# Patient Record
Sex: Female | Born: 1955
Health system: Southern US, Community
[De-identification: ages and names within clinical notes are randomized; demographics above are authoritative.]

## PROBLEM LIST (undated history)

## (undated) DIAGNOSIS — E079 Disorder of thyroid, unspecified: Secondary | ICD-10-CM

## (undated) DIAGNOSIS — R011 Cardiac murmur, unspecified: Secondary | ICD-10-CM

## (undated) DIAGNOSIS — Z1509 Genetic susceptibility to other malignant neoplasm: Secondary | ICD-10-CM

## (undated) DIAGNOSIS — Z808 Family history of malignant neoplasm of other organs or systems: Secondary | ICD-10-CM

## (undated) DIAGNOSIS — C50919 Malignant neoplasm of unspecified site of unspecified female breast: Secondary | ICD-10-CM

## (undated) DIAGNOSIS — T7840XA Allergy, unspecified, initial encounter: Secondary | ICD-10-CM

## (undated) DIAGNOSIS — E785 Hyperlipidemia, unspecified: Secondary | ICD-10-CM

## (undated) DIAGNOSIS — K219 Gastro-esophageal reflux disease without esophagitis: Secondary | ICD-10-CM

## (undated) DIAGNOSIS — C801 Malignant (primary) neoplasm, unspecified: Secondary | ICD-10-CM

## (undated) DIAGNOSIS — C55 Malignant neoplasm of uterus, part unspecified: Secondary | ICD-10-CM

## (undated) DIAGNOSIS — I10 Essential (primary) hypertension: Secondary | ICD-10-CM

## (undated) DIAGNOSIS — Z803 Family history of malignant neoplasm of breast: Secondary | ICD-10-CM

## (undated) HISTORY — PX: TUBAL LIGATION: SHX77

## (undated) HISTORY — DX: Malignant (primary) neoplasm, unspecified: C80.1

## (undated) HISTORY — DX: Disorder of thyroid, unspecified: E07.9

## (undated) HISTORY — PX: CHOLECYSTECTOMY: SHX55

## (undated) HISTORY — DX: Essential (primary) hypertension: I10

## (undated) HISTORY — PX: COLON SURGERY: SHX602

## (undated) HISTORY — DX: Family history of malignant neoplasm of breast: Z80.3

## (undated) HISTORY — DX: Family history of malignant neoplasm of other organs or systems: Z80.8

## (undated) HISTORY — DX: Genetic susceptibility to other malignant neoplasm: Z15.09

## (undated) HISTORY — DX: Malignant neoplasm of unspecified site of unspecified female breast: C50.919

## (undated) HISTORY — DX: Cardiac murmur, unspecified: R01.1

## (undated) HISTORY — PX: ABDOMINAL HYSTERECTOMY: SHX81

## (undated) HISTORY — DX: Allergy, unspecified, initial encounter: T78.40XA

## (undated) HISTORY — DX: Gastro-esophageal reflux disease without esophagitis: K21.9

## (undated) HISTORY — DX: Hyperlipidemia, unspecified: E78.5

## (undated) HISTORY — DX: Malignant neoplasm of uterus, part unspecified: C55

---

## 2015-12-23 DIAGNOSIS — K56609 Unspecified intestinal obstruction, unspecified as to partial versus complete obstruction: Secondary | ICD-10-CM | POA: Insufficient documentation

## 2015-12-24 DIAGNOSIS — I10 Essential (primary) hypertension: Secondary | ICD-10-CM | POA: Insufficient documentation

## 2015-12-24 DIAGNOSIS — R748 Abnormal levels of other serum enzymes: Secondary | ICD-10-CM | POA: Insufficient documentation

## 2017-04-09 DIAGNOSIS — Z8542 Personal history of malignant neoplasm of other parts of uterus: Secondary | ICD-10-CM | POA: Insufficient documentation

## 2017-12-01 HISTORY — PX: BREAST BIOPSY: SHX20

## 2018-11-10 DIAGNOSIS — I898 Other specified noninfective disorders of lymphatic vessels and lymph nodes: Secondary | ICD-10-CM | POA: Insufficient documentation

## 2019-10-11 ENCOUNTER — Other Ambulatory Visit: Payer: Self-pay | Admitting: Physician Assistant

## 2019-10-11 DIAGNOSIS — Z1231 Encounter for screening mammogram for malignant neoplasm of breast: Secondary | ICD-10-CM

## 2021-04-26 ENCOUNTER — Encounter: Payer: Self-pay | Admitting: Nurse Practitioner

## 2021-04-26 DIAGNOSIS — E7800 Pure hypercholesterolemia, unspecified: Secondary | ICD-10-CM | POA: Insufficient documentation

## 2021-04-26 DIAGNOSIS — E669 Obesity, unspecified: Secondary | ICD-10-CM | POA: Insufficient documentation

## 2021-04-26 DIAGNOSIS — E782 Mixed hyperlipidemia: Secondary | ICD-10-CM | POA: Insufficient documentation

## 2021-05-03 ENCOUNTER — Other Ambulatory Visit: Payer: Self-pay

## 2021-05-03 ENCOUNTER — Encounter: Payer: Self-pay | Admitting: Nurse Practitioner

## 2021-05-03 ENCOUNTER — Ambulatory Visit: Payer: BC Managed Care – PPO | Admitting: Nurse Practitioner

## 2021-05-03 VITALS — BP 121/85 | HR 73 | Temp 98.7°F | Ht 63.0 in | Wt 241.6 lb

## 2021-05-03 DIAGNOSIS — Z1509 Genetic susceptibility to other malignant neoplasm: Secondary | ICD-10-CM | POA: Insufficient documentation

## 2021-05-03 DIAGNOSIS — I10 Essential (primary) hypertension: Secondary | ICD-10-CM

## 2021-05-03 DIAGNOSIS — Z15060 Genetic susceptibility to colorectal cancer: Secondary | ICD-10-CM | POA: Insufficient documentation

## 2021-05-03 DIAGNOSIS — Z1231 Encounter for screening mammogram for malignant neoplasm of breast: Secondary | ICD-10-CM

## 2021-05-03 DIAGNOSIS — Z7689 Persons encountering health services in other specified circumstances: Secondary | ICD-10-CM | POA: Diagnosis not present

## 2021-05-03 DIAGNOSIS — Z8542 Personal history of malignant neoplasm of other parts of uterus: Secondary | ICD-10-CM

## 2021-05-03 DIAGNOSIS — Z1211 Encounter for screening for malignant neoplasm of colon: Secondary | ICD-10-CM

## 2021-05-03 DIAGNOSIS — E7800 Pure hypercholesterolemia, unspecified: Secondary | ICD-10-CM | POA: Diagnosis not present

## 2021-05-03 DIAGNOSIS — K219 Gastro-esophageal reflux disease without esophagitis: Secondary | ICD-10-CM | POA: Insufficient documentation

## 2021-05-03 DIAGNOSIS — K449 Diaphragmatic hernia without obstruction or gangrene: Secondary | ICD-10-CM | POA: Insufficient documentation

## 2021-05-03 DIAGNOSIS — Z780 Asymptomatic menopausal state: Secondary | ICD-10-CM

## 2021-05-03 DIAGNOSIS — Z6841 Body Mass Index (BMI) 40.0 and over, adult: Secondary | ICD-10-CM

## 2021-05-03 MED ORDER — NEBIVOLOL HCL 2.5 MG PO TABS: ORAL_TABLET | ORAL | 4 refills | Status: DC

## 2021-05-03 MED ORDER — LOSARTAN POTASSIUM-HCTZ 100-25 MG PO TABS: 1.0000 | ORAL_TABLET | Freq: Every day | ORAL | 4 refills | Status: DC

## 2021-05-03 NOTE — Assessment & Plan Note (Signed)
Noted on labs 2019 and recent 2020.  Continue diet and exercise focus, will plan on recheck on labs at physical in 4 weeks.

## 2021-05-03 NOTE — Assessment & Plan Note (Signed)
Ongoing with use of Nexium.  Continue to monitor and consider annual Mag level if taking Nexium consistently.

## 2021-05-03 NOTE — Assessment & Plan Note (Signed)
History of in 2012, complete hysterectomy performed.  Continue to monitor as has lymphocele reported post cancer treatment. 

## 2021-05-03 NOTE — Assessment & Plan Note (Signed)
Chronic, stable with BP at goal today on current medications.  Recommend she monitor BP at least a few mornings a week at home and document.  DASH diet at home.  Continue current medication regimen and adjust as needed.  Labs today.  Return in 4 weeks for physical with labs.

## 2021-05-03 NOTE — Assessment & Plan Note (Signed)
BMI 42.80.  Recommended eating smaller high protein, low fat meals more frequently and exercising 30 mins a day 5 times a week with a goal of 10-15lb weight loss in the next 3 months. Patient voiced their understanding and motivation to adhere to these recommendations.

## 2021-05-03 NOTE — Patient Instructions (Signed)
[facility] at [facility]  Address: [address], [place], [place] [address]  Phone: [phone]   Mammogram A mammogram is an X-ray of the breasts. This is done to check for changes that are not normal. This test can look for changes that may be caused by breast cancer or other problems. Mammograms are regularly done on women beginning at age 66. A man may have a mammogram if he has a lump or swelling in his breast. Tell a doctor:  About any allergies you have.  If you have breast implants.  If you have had breast disease, biopsy, or surgery.  If you have a family history of breast cancer.  If you are breastfeeding.  Whether you are pregnant or may be pregnant. What are the risks? Generally, this is a safe procedure. But problems may occur, including:  Being exposed to radiation. Radiation levels are very low with this test.  The need for more tests.  The results were not read properly.  Trouble finding breast cancer in women with dense breasts. What happens before the test?  Have this test done about 1-2 weeks after your menstrual period. This is often when your breasts are the least tender.  If you are visiting a new doctor or clinic, have any past mammogram images sent to your new doctor's office.  Wash your breasts and under your arms on the day of the test.  Do not use deodorants, perfumes, lotions, or powders on the day of the test.  Take off any jewelry from your neck.  Wear clothes that you can change into and out of easily. What happens during the test?  You will take off your clothes from the waist up. You will put on a gown.  You will stand in front of the X-ray machine.  Each breast will be placed between two plastic or glass plates. The plates will press down on your breast for a few seconds. Try to relax. This does not cause any harm to your breasts. It may not feel comfortable, but it will be very brief.  X-rays will be  taken from different angles of each breast. The procedure may vary among doctors and hospitals.   What can I expect after the test?  The mammogram will be read by a specialist (radiologist).  You may need to do parts of the test again. This depends on the quality of the images.  You may go back to your normal activities.  It is up to you to get the results of your test. Ask how to get your results when they are ready. Summary  A mammogram is an X-ray of the breasts. It looks for changes that may be caused by breast cancer or other problems.  A man may have this test if he has a lump or swelling in his breast.  Before the test, tell your doctor about any breast problems that you have had in the past.  Have this test done about 1-2 weeks after your menstrual period.  Ask when your test results will be ready. Make sure you get your test results. This information is not intended to replace advice given to you by your health care provider. Make sure you discuss any questions you have with your health care provider. Document Revised: 09/17/2020 Document Reviewed: 09/17/2020 Elsevier Patient Education  2021 [employer].

## 2021-05-03 NOTE — Assessment & Plan Note (Signed)
She reports she is to have colonoscopies every 2-3 years due to Lynch syndrome being noted post uterine cancer.  Will place GI referral as last colonoscopy was February 2020 per her report.

## 2021-05-03 NOTE — Progress Notes (Signed)
New Patient Office Visit  Subjective:  Patient ID: Pamela Reid, female    DOB: 10-24-1956  Age: 65 y.o. MRN: [id number]  CC:  Chief Complaint  Patient presents with  . Establish Care    Patient states she is here to have a provider for medication management and then to discuss having update health maintenance for Mammogram and Colonoscopy.    HPI Pamela Reid presents for new patient visit to establish care.  Introduced to Designer, jewellery role and practice setting.  All questions answered.  Discussed provider/patient relationship and expectations.  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  Has to have colonoscopy every 2-3 years due to Lynch syndrome -- last was in February 2020.  History of uterine cancer in 2012 -- complete hysterectomy - treated in [place], [place].    Takes Nexium for GERD.  HYPERTENSION Continues on Losartan-HCTZ 408-14 MG and Bystolic 2.5 MG daily.  Has been on these for 4-5 years, diagnosed with HTN in her 50's.  Last LDL in May 2019 was 105 on chart.  She brought in recent labs with HDL 62, TCHOL 203, LDL 120 on 10/12/2019.   Hypertension status: stable  Satisfied with current treatment? yes Duration of hypertension: chronic BP monitoring frequency:  a few times a month BP range:  120-130/80's BP medication side effects:  no Medication compliance: good compliance Aspirin: yes Recurrent headaches: no Visual changes: no Palpitations: no Dyspnea: no Chest pain: no Lower extremity edema: no Dizzy/lightheaded: no    Past Medical History:  Diagnosis Date  . Allergy   . Cancer ([place])   . GERD (gastroesophageal reflux disease)   . Hypertension   . Lynch syndrome     Past Surgical History:  Procedure Laterality Date  . ABDOMINAL HYSTERECTOMY      Family History  Problem Relation Age of Onset  . Hypertension Mother   . Parkinson's disease Father   . Dementia Father   . Healthy Sister   . Healthy  Brother   . Healthy Daughter   . Healthy Son   . Cancer Maternal Grandmother   . Cancer Maternal Grandfather   . Cancer Paternal Grandmother        breast cancer  . Parkinson's disease Paternal Grandfather     Social History   Socioeconomic History  . Marital status: Married    Spouse name: Not on file  . Number of children: Not on file  . Years of education: Not on file  . Highest education level: Not on file  Occupational History  . Not on file  Tobacco Use  . Smoking status: Never Smoker  . Smokeless tobacco: Never Used  Vaping Use  . Vaping Use: Never used  Substance and Sexual Activity  . Alcohol use: Not on file    Comment: occasional  . Drug use: Never  . Sexual activity: Yes  Other Topics Concern  . Not on file  Social History Narrative  . Not on file   Social Determinants of Health   Financial Resource Strain: Low Risk   . Difficulty of Paying Living Expenses: Not hard at all  Food Insecurity: No Food Insecurity  . Worried About Charity fundraiser in the Last Year: Never true  . Ran Out of Food in the Last Year: Never true  Transportation Needs: No Transportation Needs  . Lack of Transportation (Medical): No  . Lack of Transportation (Non-Medical): No  Physical Activity:  Sufficiently Active  . Days of Exercise per Week: 5 days  . Minutes of Exercise per Session: 60 min  Stress: No Stress Concern Present  . Feeling of Stress : Only a little  Social Connections: Moderately Isolated  . Frequency of Communication with Friends and Family: More than three times a week  . Frequency of Social Gatherings with Friends and Family: More than three times a week  . Attends Religious Services: Never  . Active Member of Clubs or Organizations: No  . Attends Archivist Meetings: Never  . Marital Status: Married  Human resources officer Violence: Not At Risk  . Fear of Current or Ex-Partner: No  . Emotionally Abused: No  . Physically Abused: No  . Sexually  Abused: No    ROS Review of Systems  Constitutional: Negative for activity change, appetite change, diaphoresis, fatigue and fever.  Respiratory: Negative for cough, chest tightness and shortness of breath.   Cardiovascular: Negative for chest pain, palpitations and leg swelling.  Gastrointestinal: Negative.   Endocrine: Negative for cold intolerance, heat intolerance, polydipsia, polyphagia and polyuria.  Neurological: Negative.   Psychiatric/Behavioral: Negative.     Objective:   Today's Vitals: BP 121/85   Pulse 73   Temp 98.7 F (37.1 C) (Oral)   Ht 5\' 3"  (1.6 m)   Wt 241 lb 9.6 oz (109.6 kg)   SpO2 98%   BMI 42.80 kg/m   Physical Exam Vitals and nursing note reviewed.  Constitutional:      General: She is awake. She is not in acute distress.    Appearance: She is well-developed and well-groomed. She is obese. She is not ill-appearing or toxic-appearing.  HENT:     Head: Normocephalic.     Right Ear: Hearing normal.     Left Ear: Hearing normal.  Eyes:     General: Lids are normal.        Right eye: No discharge.        Left eye: No discharge.     Conjunctiva/sclera: Conjunctivae normal.     Pupils: Pupils are equal, round, and reactive to light.  Neck:     Thyroid: No thyromegaly.     Vascular: No carotid bruit or JVD.  Cardiovascular:     Rate and Rhythm: Normal rate and regular rhythm.     Heart sounds: Normal heart sounds. No murmur heard. No gallop.   Pulmonary:     Effort: Pulmonary effort is normal. No accessory muscle usage or respiratory distress.     Breath sounds: Normal breath sounds.  Abdominal:     General: Bowel sounds are normal.     Palpations: Abdomen is soft.  Musculoskeletal:     Cervical back: Normal range of motion and neck supple.     Right lower leg: No edema.     Left lower leg: No edema.  Lymphadenopathy:     Cervical: No cervical adenopathy.  Skin:    General: Skin is warm and dry.  Neurological:     Mental Status: She is  alert and oriented to person, place, and time.     Deep Tendon Reflexes:     Reflex Scores:      Brachioradialis reflexes are 1+ on the right side and 1+ on the left side.      Patellar reflexes are 1+ on the right side and 1+ on the left side. Psychiatric:        Attention and Perception: Attention normal.  Mood and Affect: Mood normal.        Speech: Speech normal.        Behavior: Behavior normal. Behavior is cooperative.        Thought Content: Thought content normal.     Assessment & Plan:   Problem List Items Addressed This Visit      Cardiovascular and Mediastinum   Hypertension, essential    Chronic, stable with BP at goal today on current medications.  Recommend she monitor BP at least a few mornings a week at home and document.  DASH diet at home.  Continue current medication regimen and adjust as needed.  Labs today.  Return in 4 weeks for physical with labs.       Relevant Medications   Aspirin 81 MG CAPS   losartan-hydrochlorothiazide (HYZAAR) 100-25 MG tablet   nebivolol (BYSTOLIC) 2.5 MG tablet     Digestive   GERD (gastroesophageal reflux disease)    Ongoing with use of Nexium.  Continue to monitor and consider annual Mag level if taking Nexium consistently.        Relevant Medications   Calcium Polycarbophil (FIBER) 625 MG TABS   esomeprazole (NEXIUM) 20 MG capsule     Other   History of endometrial cancer    History of in 2012, complete hysterectomy performed.  Continue to monitor as has lymphocele reported post cancer treatment.      Obesity    BMI 42.80.  Recommended eating smaller high protein, low fat meals more frequently and exercising 30 mins a day 5 times a week with a goal of 10-15lb weight loss in the next 3 months. Patient voiced their understanding and motivation to adhere to these recommendations.       Elevated low density lipoprotein (LDL) cholesterol level    Noted on labs 2019 and recent 2020.  Continue diet and exercise focus,  will plan on recheck on labs at physical in 4 weeks.      Lynch syndrome    She reports she is to have colonoscopies every 2-3 years due to Lynch syndrome being noted post uterine cancer.  Will place GI referral as last colonoscopy was February 2020 per her report.      Relevant Orders   Ambulatory referral to Gastroenterology    Other Visit Diagnoses    Encounter to establish care    -  Primary   Postmenopausal estrogen deficiency       DEXA scan due and ordered today with directions on where to schedule provided   Relevant Orders   DG Bone Density   Encounter for screening mammogram for malignant neoplasm of breast       Mammogram due and ordered today with directions on where to schedule provided   Relevant Orders   MM 3D SCREEN BREAST BILATERAL   Colon cancer screening       GI referral in place.   Relevant Orders   Ambulatory referral to Gastroenterology      Outpatient Encounter Medications as of 05/03/2021  Medication Sig  . Aspirin 81 MG CAPS Take by mouth.  . Calcium Polycarbophil (FIBER) 625 MG TABS Take by mouth.  . Cholecalciferol 75 MCG (3000 UT) TABS Take by mouth.  . co-enzyme Q-10 30 MG capsule Take by mouth.  . esomeprazole (NEXIUM) 20 MG capsule Take by mouth.  Marland Kitchen ibuprofen (ADVIL) 200 MG tablet Take by mouth.  . Multiple Vitamin (MULTI-VITAMINS) TABS Take 1 tablet by mouth daily.  . [DISCONTINUED] losartan (COZAAR) 50  MG tablet   . [DISCONTINUED] nebivolol (BYSTOLIC) 2.5 MG tablet TAKE 1 TABLET DAILY BY MOUTH FOR HYPERTENSION  . losartan-hydrochlorothiazide (HYZAAR) 100-25 MG tablet Take 1 tablet by mouth daily.  . nebivolol (BYSTOLIC) 2.5 MG tablet TAKE 1 TABLET DAILY BY MOUTH FOR HYPERTENSION  . [DISCONTINUED] losartan-hydrochlorothiazide (HYZAAR) 100-25 MG tablet Take 1 tablet by mouth daily.   No facility-administered encounter medications on file as of 05/03/2021.    Follow-up: Return in about 4 weeks (around 05/31/2021) for Annual physical.   Venita Lick, NP

## 2021-05-06 ENCOUNTER — Encounter: Payer: Self-pay | Admitting: Gastroenterology

## 2021-05-31 ENCOUNTER — Encounter: Payer: BC Managed Care – PPO | Admitting: Nurse Practitioner

## 2021-06-26 ENCOUNTER — Ambulatory Visit: Payer: BC Managed Care – PPO | Admitting: Nurse Practitioner

## 2021-07-16 ENCOUNTER — Ambulatory Visit: Payer: BC Managed Care – PPO | Admitting: Nurse Practitioner

## 2021-07-22 ENCOUNTER — Other Ambulatory Visit: Payer: Self-pay

## 2021-07-22 ENCOUNTER — Ambulatory Visit: Payer: BC Managed Care – PPO | Admitting: Nurse Practitioner

## 2021-07-22 ENCOUNTER — Encounter: Payer: Self-pay | Admitting: Nurse Practitioner

## 2021-07-22 VITALS — BP 134/79 | HR 66 | Temp 98.3°F | Ht 63.0 in | Wt 241.8 lb

## 2021-07-22 DIAGNOSIS — Z0000 Encounter for general adult medical examination without abnormal findings: Secondary | ICD-10-CM | POA: Diagnosis not present

## 2021-07-22 DIAGNOSIS — I10 Essential (primary) hypertension: Secondary | ICD-10-CM | POA: Diagnosis not present

## 2021-07-22 DIAGNOSIS — E7800 Pure hypercholesterolemia, unspecified: Secondary | ICD-10-CM

## 2021-07-22 DIAGNOSIS — Z1211 Encounter for screening for malignant neoplasm of colon: Secondary | ICD-10-CM

## 2021-07-22 DIAGNOSIS — Z23 Encounter for immunization: Secondary | ICD-10-CM

## 2021-07-22 NOTE — Patient Instructions (Addendum)
[facility] at [facility]  Address: [address], [place], [place] [address]  Phone: [phone]  Bone Density Test A bone density test uses a type of X-ray to measure the amount of calcium and other minerals in a person's bones. It can measure bone density in the hip and the spine. The test is similar to having a regular X-ray. This test may also be called: Bone densitometry. Bone mineral density test. Dual-energy X-ray absorptiometry (DEXA). You may have this test to: Diagnose a condition that causes weak or thin bones (osteoporosis). Screen you for osteoporosis. Predict your risk for a broken bone (fracture). Determine how well your osteoporosis treatment is working. Tell a health care provider about: Any allergies you have. All medicines you are taking, including vitamins, herbs, eye drops, creams, and over-the-counter medicines. Any problems you or family members have had with anesthetic medicines. Any blood disorders you have. Any surgeries you have had. Any medical conditions you have. Whether you are pregnant or may be pregnant. Any medical tests you have had within the past 14 days that used contrast material. What are the risks? Generally, this is a safe test. However, it does expose you to a small amountof radiation, which can slightly increase your cancer risk. What happens before the test? Do not take any calcium supplements within the 24 hours before your test. You will need to remove all metal jewelry, eyeglasses, removable dental appliances, and any other metal objects on your body. What happens during the test?  You will lie down on an exam table. There will be an X-ray generator below you and an imaging device above you. Other devices, such as boxes or braces, may be used to position your body properly for the scan. The machine will slowly scan your body. You will need to keep very still while the machine does the scan. The images will  show up on a screen in the room. Images will be examined by a specialist after your test is finished. The procedure may vary among health care providers and hospitals. What can I expect after the test? It is up to you to get the results of your test. Ask your health care provider,or the department that is doing the test, when your results will be ready. Summary A bone density test is an imaging test that uses a type of X-ray to measure the amount of calcium and other minerals in your bones. The test may be used to diagnose or screen you for a condition that causes weak or thin bones (osteoporosis), predict your risk for a broken bone (fracture), or determine how well your osteoporosis treatment is working. Do not take any calcium supplements within 24 hours before your test. Ask your health care provider, or the department that is doing the test, when your results will be ready. This information is not intended to replace advice given to you by your health care provider. Make sure you discuss any questions you have with your healthcare provider. Document Revised: 05/03/2020 Document Reviewed: 05/03/2020 Elsevier Patient Education  [employer].

## 2021-07-22 NOTE — Assessment & Plan Note (Signed)
Health maintenance reviewed with patient: - PCV13 provided today -- PPSV23 in one year - Covid vaccinations up to date - Tetanus - up to date - Shingrix - up to date - Colonoscopy - ordered today - Mammogram - ordered today - Dexa scan - ordered today

## 2021-07-22 NOTE — Progress Notes (Signed)
BP 134/79   Pulse 66   Temp 98.3 F (36.8 C) (Oral)   Ht '5\' 3"'$  (1.6 m)   Wt 241 lb 12.8 oz (109.7 kg)   SpO2 98%   BMI 42.83 kg/m    Subjective:    Patient ID: Pamela Reid, female    DOB: 07/09/1956, 65 y.o.   MRN: [id number]  HPI: Pamela Reid is a 65 y.o. female presenting on 07/22/2021 for comprehensive medical examination. Current medical complaints include:none  She currently lives with: husband Menopausal Symptoms: no  Depression Screen done today and results listed below:  Depression screen [facility] 2/9 07/22/2021 05/03/2021  Decreased Interest 0 0  Down, Depressed, Hopeless 0 0  PHQ - 2 Score 0 0    The patient does not have a history of falls. I did not complete a risk assessment for falls. A plan of care for falls was not documented.  Functional Status Survey: Is the patient deaf or have difficulty hearing?: No Does the patient have difficulty seeing, even when wearing glasses/contacts?: No Does the patient have difficulty concentrating, remembering, or making decisions?: No Does the patient have difficulty walking or climbing stairs?: No Does the patient have difficulty dressing or bathing?: No Does the patient have difficulty doing errands alone such as visiting a doctor's office or shopping?: No   Past Medical History:  Past Medical History:  Diagnosis Date   Allergy    Cancer ([place])    GERD (gastroesophageal reflux disease)    Hypertension    Lynch syndrome     Surgical History:  Past Surgical History:  Procedure Laterality Date   ABDOMINAL HYSTERECTOMY      Medications:  Current Outpatient Medications on File Prior to Visit  Medication Sig   Aspirin 81 MG CAPS Take by mouth.   Calcium Polycarbophil (FIBER) 625 MG TABS Take by mouth.   Cholecalciferol 75 MCG (3000 UT) TABS Take by mouth.   co-enzyme Q-10 30 MG capsule Take by mouth.   esomeprazole (NEXIUM) 20 MG capsule Take by mouth.   ibuprofen (ADVIL) 200 MG tablet Take by mouth.    losartan-hydrochlorothiazide (HYZAAR) 100-25 MG tablet Take 1 tablet by mouth daily.   Multiple Vitamin (MULTI-VITAMINS) TABS Take 1 tablet by mouth daily.   nebivolol (BYSTOLIC) 2.5 MG tablet TAKE 1 TABLET DAILY BY MOUTH FOR HYPERTENSION   No current facility-administered medications on file prior to visit.    Allergies:  Allergies  Allergen Reactions   Cephalexin Hives and Rash    dermatitis dermatitis    Penicillins Hives and Rash    dermatitis dermatitis     Social History:  Social History   Socioeconomic History   Marital status: Married    Spouse name: Not on file   Number of children: Not on file   Years of education: Not on file   Highest education level: Not on file  Occupational History   Not on file  Tobacco Use   Smoking status: Never   Smokeless tobacco: Never  Vaping Use   Vaping Use: Never used  Substance and Sexual Activity   Alcohol use: Not on file    Comment: occasional   Drug use: Never   Sexual activity: Yes  Other Topics Concern   Not on file  Social History Narrative   Not on file   Social Determinants of Health   Financial Resource Strain: Low Risk    Difficulty of Paying Living Expenses: Not hard at all  Food Insecurity: No Food Insecurity  Worried About Charity fundraiser in the Last Year: Never true   [place] in the Last Year: Never true  Transportation Needs: No Transportation Needs   Lack of Transportation (Medical): No   Lack of Transportation (Non-Medical): No  Physical Activity: Sufficiently Active   Days of Exercise per Week: 5 days   Minutes of Exercise per Session: 60 min  Stress: No Stress Concern Present   Feeling of Stress : Only a little  Social Connections: Moderately Isolated   Frequency of Communication with Friends and Family: More than three times a week   Frequency of Social Gatherings with Friends and Family: More than three times a week   Attends Religious Services: Never   Marine scientist  or Organizations: No   Attends Music therapist: Never   Marital Status: Married  Human resources officer Violence: Not At Risk   Fear of Current or Ex-Partner: No   Emotionally Abused: No   Physically Abused: No   Sexually Abused: No   Social History   Tobacco Use  Smoking Status Never  Smokeless Tobacco Never   Social History   Substance and Sexual Activity  Alcohol Use None   Comment: occasional    Family History:  Family History  Problem Relation Age of Onset   Hypertension Mother    Parkinson's disease Father    Dementia Father    Healthy Sister    Healthy Brother    Healthy Daughter    Healthy Son    Cancer Maternal Grandmother    Cancer Maternal Grandfather    Cancer Paternal Grandmother        breast cancer   Parkinson's disease Paternal Grandfather     Past medical history, surgical history, medications, allergies, family history and social history reviewed with patient today and changes made to appropriate areas of the chart.   ROS All other ROS negative except what is listed above and in the HPI.      Objective:    BP 134/79   Pulse 66   Temp 98.3 F (36.8 C) (Oral)   Ht '5\' 3"'$  (1.6 m)   Wt 241 lb 12.8 oz (109.7 kg)   SpO2 98%   BMI 42.83 kg/m   Wt Readings from Last 3 Encounters:  07/22/21 241 lb 12.8 oz (109.7 kg)  05/03/21 241 lb 9.6 oz (109.6 kg)    Physical Exam Vitals and nursing note reviewed.  Constitutional:      General: She is awake. She is not in acute distress.    Appearance: She is well-developed. She is not ill-appearing.  HENT:     Head: Normocephalic and atraumatic.     Right Ear: Hearing, tympanic membrane, ear canal and external ear normal. No drainage.     Left Ear: Hearing, tympanic membrane, ear canal and external ear normal. No drainage.     Nose: Nose normal.     Right Sinus: No maxillary sinus tenderness or frontal sinus tenderness.     Left Sinus: No maxillary sinus tenderness or frontal sinus tenderness.      Mouth/Throat:     Mouth: Mucous membranes are moist.     Pharynx: Oropharynx is clear. Uvula midline. No pharyngeal swelling, oropharyngeal exudate or posterior oropharyngeal erythema.  Eyes:     General: Lids are normal.        Right eye: No discharge.        Left eye: No discharge.     Extraocular Movements: Extraocular  movements intact.     Conjunctiva/sclera: Conjunctivae normal.     Pupils: Pupils are equal, round, and reactive to light.     Visual Fields: Right eye visual fields normal and left eye visual fields normal.  Neck:     Thyroid: No thyromegaly.     Vascular: No carotid bruit.     Trachea: Trachea normal.  Cardiovascular:     Rate and Rhythm: Normal rate and regular rhythm.     Heart sounds: Normal heart sounds. No murmur heard.   No gallop.  Pulmonary:     Effort: Pulmonary effort is normal. No accessory muscle usage or respiratory distress.     Breath sounds: Normal breath sounds.  Chest:     Comments: Deferred per patient request, does home self exams and denies any concerns. Abdominal:     General: Bowel sounds are normal.     Palpations: Abdomen is soft. There is no hepatomegaly or splenomegaly.     Tenderness: There is no abdominal tenderness.  Musculoskeletal:        General: Normal range of motion.     Cervical back: Normal range of motion and neck supple.     Right lower leg: No edema.     Left lower leg: No edema.  Lymphadenopathy:     Head:     Right side of head: No submental, submandibular, tonsillar, preauricular or posterior auricular adenopathy.     Left side of head: No submental, submandibular, tonsillar, preauricular or posterior auricular adenopathy.     Cervical: No cervical adenopathy.  Skin:    General: Skin is warm and dry.     Capillary Refill: Capillary refill takes less than 2 seconds.     Findings: No rash.  Neurological:     Mental Status: She is alert and oriented to person, place, and time.     Cranial Nerves: Cranial  nerves are intact.     Gait: Gait is intact.     Deep Tendon Reflexes: Reflexes are normal and symmetric.     Reflex Scores:      Brachioradialis reflexes are 2+ on the right side and 2+ on the left side.      Patellar reflexes are 2+ on the right side and 2+ on the left side. Psychiatric:        Attention and Perception: Attention normal.        Mood and Affect: Mood normal.        Speech: Speech normal.        Behavior: Behavior normal. Behavior is cooperative.        Thought Content: Thought content normal.        Judgment: Judgment normal.    No results found for this or any previous visit.    Assessment & Plan:   Problem List Items Addressed This Visit       Cardiovascular and Mediastinum   Hypertension, essential - Primary   Relevant Orders   CBC with Differential/Platelet   Comprehensive metabolic panel   Lipid Panel w/o Chol/HDL Ratio   TSH     Other   Elevated low density lipoprotein (LDL) cholesterol level   Relevant Orders   Lipid Panel w/o Chol/HDL Ratio   Health care maintenance    Health maintenance reviewed with patient: - PCV13 provided today -- PPSV23 in one year - Covid vaccinations up to date - Tetanus - up to date - Shingrix - up to date - Colonoscopy - ordered today - Mammogram - ordered  today - Dexa scan - ordered today      Other Visit Diagnoses     Screen for colon cancer       Missed last GI referral due to work, new referral placed for colonoscopy.   Relevant Orders   Ambulatory referral to Gastroenterology   Pneumococcal vaccination given       PCV13 due today and administered.   Relevant Orders   Pneumococcal conjugate vaccine 13-valent   Annual physical exam       Annual physical today with labs.  Health maintenance reviewed with patient.        Follow up plan: Return in about 6 months (around 01/22/2022) for HTN.   LABORATORY TESTING:  - Pap smear: not applicable  IMMUNIZATIONS:   - Tdap: Tetanus vaccination status  reviewed: last tetanus booster within 10 years. - Influenza: Up to date - Pneumovax: Administered today - Prevnar: Not applicable - HPV: Not applicable - Zostavax vaccine: Up to date  SCREENING: -Mammogram: Ordered today  - Colonoscopy: Ordered today  - Bone Density: Ordered today  -Hearing Test: Not applicable  -Spirometry: Not applicable   PATIENT COUNSELING:   Advised to take 1 mg of folate supplement per day if capable of pregnancy.   Sexuality: Discussed sexually transmitted diseases, partner selection, use of condoms, avoidance of unintended pregnancy  and contraceptive alternatives.   Advised to avoid cigarette smoking.  I discussed with the patient that most people either abstain from alcohol or drink within safe limits (<=14/week and <=4 drinks/occasion for males, <=7/weeks and <= 3 drinks/occasion for females) and that the risk for alcohol disorders and other health effects rises proportionally with the number of drinks per week and how often a drinker exceeds daily limits.  Discussed cessation/primary prevention of drug use and availability of treatment for abuse.   Diet: Encouraged to adjust caloric intake to maintain  or achieve ideal body weight, to reduce intake of dietary saturated fat and total fat, to limit sodium intake by avoiding high sodium foods and not adding table salt, and to maintain adequate dietary potassium and calcium preferably from fresh fruits, vegetables, and low-fat dairy products.    Stressed the importance of regular exercise  Injury prevention: Discussed safety belts, safety helmets, smoke detector, smoking near bedding or upholstery.   Dental health: Discussed importance of regular tooth brushing, flossing, and dental visits.    NEXT PREVENTATIVE PHYSICAL DUE IN 1 YEAR. Return in about 6 months (around 01/22/2022) for HTN.

## 2021-07-23 LAB — COMPREHENSIVE METABOLIC PANEL
ALT: 18 IU/L (ref 0–32)
AST: 17 IU/L (ref 0–40)
Albumin/Globulin Ratio: 1.4 (ref 1.2–2.2)
Albumin: 4.2 g/dL (ref 3.8–4.8)
Alkaline Phosphatase: 127 IU/L — ABNORMAL HIGH (ref 44–121)
BUN/Creatinine Ratio: 17 (ref 12–28)
BUN: 11 mg/dL (ref 8–27)
Bilirubin Total: 0.6 mg/dL (ref 0.0–1.2)
CO2: 21 mmol/L (ref 20–29)
Calcium: 9.6 mg/dL (ref 8.7–10.3)
Chloride: 99 mmol/L (ref 96–106)
Creatinine, Ser: 0.65 mg/dL (ref 0.57–1.00)
Globulin, Total: 2.9 g/dL (ref 1.5–4.5)
Glucose: 101 mg/dL — ABNORMAL HIGH (ref 65–99)
Potassium: 4 mmol/L (ref 3.5–5.2)
Sodium: 140 mmol/L (ref 134–144)
Total Protein: 7.1 g/dL (ref 6.0–8.5)
eGFR: 97 mL/min/{1.73_m2} (ref >59)

## 2021-07-23 LAB — CBC WITH DIFFERENTIAL/PLATELET
Basophils Absolute: 0 10*3/uL (ref 0.0–0.2)
Basos: 0 %
EOS (ABSOLUTE): 0.1 10*3/uL (ref 0.0–0.4)
Eos: 2 %
Hematocrit: 42.8 % (ref 34.0–46.6)
Hemoglobin: 14.4 g/dL (ref 11.1–15.9)
Immature Grans (Abs): 0 10*3/uL (ref 0.0–0.1)
Immature Granulocytes: 0 %
Lymphocytes Absolute: 1 10*3/uL (ref 0.7–3.1)
Lymphs: 15 %
MCH: 30.1 pg (ref 26.6–33.0)
MCHC: 33.6 g/dL (ref 31.5–35.7)
MCV: 90 fL (ref 79–97)
Monocytes Absolute: 0.5 10*3/uL (ref 0.1–0.9)
Monocytes: 7 %
Neutrophils Absolute: 5.1 10*3/uL (ref 1.4–7.0)
Neutrophils: 76 %
Platelets: 279 10*3/uL (ref 150–450)
RBC: 4.78 x10E6/uL (ref 3.77–5.28)
RDW: 12.1 % (ref 11.7–15.4)
WBC: 6.8 10*3/uL (ref 3.4–10.8)

## 2021-07-23 LAB — LIPID PANEL W/O CHOL/HDL RATIO
Cholesterol, Total: 218 mg/dL — ABNORMAL HIGH (ref 100–199)
HDL: 71 mg/dL (ref >39)
LDL Chol Calc (NIH): 120 mg/dL — ABNORMAL HIGH (ref 0–99)
Triglycerides: 155 mg/dL — ABNORMAL HIGH (ref 0–149)
VLDL Cholesterol Cal: 27 mg/dL (ref 5–40)

## 2021-07-23 LAB — TSH: TSH: 3.19 u[IU]/mL (ref 0.450–4.500)

## 2021-07-23 NOTE — Progress Notes (Signed)
Contacted via MyChart The 10-year ASCVD risk score Mikey Bussing DC Jr., et al., 2013) is: 8.6%   Values used to calculate the score:     Age: 65 years     Sex: Female     Is Non-Hispanic African American: No     Diabetic: No     Tobacco smoker: No     Systolic Blood Pressure: Q000111Q mmHg     Is BP treated: Yes     HDL Cholesterol: 71 mg/dL     Total Cholesterol: 218 mg/dL    Good afternoon Cecille Rubin, your labs have returned: - CBC is stable with no anemia - CMP shows mild elevation in glucose and alkaline phosphatase, but overall reassuring. - Thyroid is normal - Cholesterol levels are elevated, but based on ASCVD being <10%, I will add this below, and LDL being less then 190 we can continue to monitor and if elevations further present we can add medication on in future.  Any questions? The 10-year ASCVD risk score Mikey Bussing DC Brooke Bonito., et al., 2013) is: 8.6%   Values used to calculate the score:     Age: 56 years     Sex: Female     Is Non-Hispanic African American: No     Diabetic: No     Tobacco smoker: No     Systolic Blood Pressure: Q000111Q mmHg     Is BP treated: Yes     HDL Cholesterol: 71 mg/dL     Total Cholesterol: 218 mg/dL Keep being stellar!!  Thank you for allowing me to participate in your care.  I appreciate you. Kindest regards, Oseph Imburgia

## 2021-07-31 ENCOUNTER — Telehealth: Payer: Self-pay

## 2021-07-31 ENCOUNTER — Other Ambulatory Visit: Payer: Self-pay

## 2021-07-31 DIAGNOSIS — Z86010 Personal history of colonic polyps: Secondary | ICD-10-CM

## 2021-07-31 MED ORDER — NA SULFATE-K SULFATE-MG SULF 17.5-3.13-1.6 GM/177ML PO SOLN: 354.0000 mL | Freq: Once | ORAL | 0 refills | Status: AC

## 2021-07-31 NOTE — Telephone Encounter (Signed)
Gastroenterology Pre-Procedure Review  Request Date: 09/11/2021 Requesting Physician: Dr. Marius Ditch  PATIENT REVIEW QUESTIONS: The patient responded to the following health history questions as indicated:    1. Are you having any GI issues? no 2. Do you have a personal history of Polyps? Yes 2.5 years ago  3. Do you have a family history of Colon Cancer or Polyps? no 4. Diabetes Mellitus? no 5. Joint replacements in the past 12 months?no 6. Major health problems in the past 3 months?no 7. Any artificial heart valves, MVP, or defibrillator?no    MEDICATIONS & ALLERGIES:    Patient reports the following regarding taking any anticoagulation/antiplatelet therapy:   Plavix, Coumadin, Eliquis, Xarelto, Lovenox, Pradaxa, Brilinta, or Effient? no Aspirin? Yes Baby Asprin daily   Patient confirms/reports the following medications:  Current Outpatient Medications  Medication Sig Dispense Refill   Aspirin 81 MG CAPS Take by mouth.     Calcium Polycarbophil (FIBER) 625 MG TABS Take by mouth.     Cholecalciferol 75 MCG (3000 UT) TABS Take by mouth.     co-enzyme Q-10 30 MG capsule Take by mouth.     esomeprazole (NEXIUM) 20 MG capsule Take by mouth.     ibuprofen (ADVIL) 200 MG tablet Take by mouth.     losartan-hydrochlorothiazide (HYZAAR) 100-25 MG tablet Take 1 tablet by mouth daily. 90 tablet 4   Multiple Vitamin (MULTI-VITAMINS) TABS Take 1 tablet by mouth daily.     nebivolol (BYSTOLIC) 2.5 MG tablet TAKE 1 TABLET DAILY BY MOUTH FOR HYPERTENSION 90 tablet 4   No current facility-administered medications for this visit.    Patient confirms/reports the following allergies:  Allergies  Allergen Reactions   Cephalexin Hives and Rash    dermatitis dermatitis    Penicillins Hives and Rash    dermatitis dermatitis     No orders of the defined types were placed in this encounter.   AUTHORIZATION INFORMATION Primary Insurance: 1D#: Group #:  Secondary Insurance: 1D#: Group  #:  SCHEDULE INFORMATION: Date:  Time: Location:

## 2021-09-11 ENCOUNTER — Ambulatory Visit: Payer: Medicare Other | Admitting: Anesthesiology

## 2021-09-11 ENCOUNTER — Encounter: Payer: Self-pay | Admitting: Gastroenterology

## 2021-09-11 ENCOUNTER — Encounter
Admission: RE | Disposition: A | Discharge status: Home / Self Care | Payer: Self-pay | Source: Home / Self Care | Attending: Gastroenterology

## 2021-09-11 ENCOUNTER — Ambulatory Visit
Admission: RE | Admit: 2021-09-11 | Discharge: 2021-09-11 | Disposition: A | Discharge status: Home / Self Care | Payer: Medicare Other | Attending: Gastroenterology | Admitting: Gastroenterology

## 2021-09-11 ENCOUNTER — Telehealth: Payer: Self-pay

## 2021-09-11 DIAGNOSIS — Z9049 Acquired absence of other specified parts of digestive tract: Secondary | ICD-10-CM | POA: Diagnosis not present

## 2021-09-11 DIAGNOSIS — Z79899 Other long term (current) drug therapy: Secondary | ICD-10-CM | POA: Diagnosis not present

## 2021-09-11 DIAGNOSIS — K5730 Diverticulosis of large intestine without perforation or abscess without bleeding: Secondary | ICD-10-CM | POA: Diagnosis not present

## 2021-09-11 DIAGNOSIS — Z881 Allergy status to other antibiotic agents status: Secondary | ICD-10-CM | POA: Diagnosis not present

## 2021-09-11 DIAGNOSIS — Z1211 Encounter for screening for malignant neoplasm of colon: Secondary | ICD-10-CM | POA: Diagnosis not present

## 2021-09-11 DIAGNOSIS — Z880 Allergy status to penicillin: Secondary | ICD-10-CM | POA: Diagnosis not present

## 2021-09-11 DIAGNOSIS — Z860100 Personal history of colon polyps, unspecified: Secondary | ICD-10-CM

## 2021-09-11 DIAGNOSIS — Z7982 Long term (current) use of aspirin: Secondary | ICD-10-CM | POA: Diagnosis not present

## 2021-09-11 DIAGNOSIS — Z1509 Genetic susceptibility to other malignant neoplasm: Secondary | ICD-10-CM

## 2021-09-11 DIAGNOSIS — K219 Gastro-esophageal reflux disease without esophagitis: Secondary | ICD-10-CM | POA: Diagnosis not present

## 2021-09-11 HISTORY — PX: COLONOSCOPY WITH PROPOFOL: SHX5780

## 2021-09-11 SURGERY — COLONOSCOPY WITH PROPOFOL
Anesthesia: General

## 2021-09-11 MED ORDER — PROPOFOL 500 MG/50ML IV EMUL
INTRAVENOUS | Status: AC
  Filled 2021-09-11: qty 100

## 2021-09-11 MED ORDER — SODIUM CHLORIDE 0.9 % IV SOLN: INTRAVENOUS | Status: DC

## 2021-09-11 MED ORDER — PROPOFOL 10 MG/ML IV BOLUS
INTRAVENOUS | Status: DC | PRN
  Administered 2021-09-11: 70 mg via INTRAVENOUS

## 2021-09-11 MED ORDER — PROPOFOL 500 MG/50ML IV EMUL
INTRAVENOUS | Status: DC | PRN
  Administered 2021-09-11: 120 ug/kg/min via INTRAVENOUS

## 2021-09-11 NOTE — Anesthesia Preprocedure Evaluation (Signed)
Anesthesia Evaluation  Patient identified by MRN, date of birth, ID band Patient awake    Reviewed: Allergy & Precautions, NPO status , Patient's Chart, lab work & pertinent test results  History of Anesthesia Complications Negative for: history of anesthetic complications  Airway Mallampati: III   Neck ROM: Full    Dental no notable dental hx.    Pulmonary neg pulmonary ROS,    Pulmonary exam normal breath sounds clear to auscultation       Cardiovascular hypertension, Normal cardiovascular exam Rhythm:Regular Rate:Normal     Neuro/Psych negative neurological ROS     GI/Hepatic GERD  ,  Endo/Other  Class 3 obesity  Renal/GU negative Renal ROS     Musculoskeletal   Abdominal   Peds  Hematology Lynch syndrome   Anesthesia Other Findings   Reproductive/Obstetrics                             Anesthesia Physical Anesthesia Plan  ASA: 3  Anesthesia Plan: General   Post-op Pain Management:    Induction: Intravenous  PONV Risk Score and Plan: 3 and Propofol infusion, TIVA and Treatment may vary due to age or medical condition  Airway Management Planned: Natural Airway  Additional Equipment:   Intra-op Plan:   Post-operative Plan:   Informed Consent: I have reviewed the patients History and Physical, chart, labs and discussed the procedure including the risks, benefits and alternatives for the proposed anesthesia with the patient or authorized representative who has indicated his/her understanding and acceptance.       Plan Discussed with: CRNA  Anesthesia Plan Comments:         Anesthesia Quick Evaluation

## 2021-09-11 NOTE — Telephone Encounter (Signed)
Placed referral to genetic counseling  

## 2021-09-11 NOTE — Anesthesia Postprocedure Evaluation (Signed)
Anesthesia Post Note  Patient: Pamela Reid  Procedure(s) Performed: COLONOSCOPY WITH PROPOFOL  Patient location during evaluation: PACU Anesthesia Type: General Level of consciousness: awake and alert, oriented and patient cooperative Pain management: pain level controlled Vital Signs Assessment: post-procedure vital signs reviewed and stable Respiratory status: spontaneous breathing, nonlabored ventilation and respiratory function stable Cardiovascular status: blood pressure returned to baseline and stable Postop Assessment: adequate PO intake Anesthetic complications: no   No notable events documented.   Last Vitals:  Vitals:   09/11/21 0812 09/11/21 0822  BP: (!) 109/55 124/63  Pulse: 67 64  Resp: (!) 24 17  Temp:    SpO2: 97% 95%    Last Pain:  Vitals:   09/11/21 0822  TempSrc:   PainSc: 0-No pain                 Darrin Nipper

## 2021-09-11 NOTE — Transfer of Care (Signed)
Immediate Anesthesia Transfer of Care Note  Patient: Pamela Reid  Procedure(s) Performed: COLONOSCOPY WITH PROPOFOL  Patient Location: PACU and Endoscopy Unit  Anesthesia Type:General  Level of Consciousness: drowsy  Airway & Oxygen Therapy: Patient Spontanous Breathing  Post-op Assessment: Report given to RN  Post vital signs: stable  Last Vitals:  Vitals Value Taken Time  BP 99/55 09/11/21 0802  Temp 35.7 C 09/11/21 0802  Pulse 74 09/11/21 0802  Resp 23 09/11/21 0802  SpO2 95 % 09/11/21 0802    Last Pain:  Vitals:   09/11/21 0802  TempSrc: Temporal  PainSc: Asleep         Complications: No notable events documented.

## 2021-09-11 NOTE — Op Note (Signed)
[facility] Gastroenterology Patient Name: Pamela Reid Procedure Date: 09/11/2021 7:04 AM MRN: [id number] Account #: [id number] Date of Birth: 1956-02-08 Admit Type: Outpatient Age: 65 Room: [facility] ENDO ROOM 4 Gender: Female Note Status: Finalized Instrument Name: Jasper Riling [id number] Procedure:             Colonoscopy Indications:           Screening for colorectal malignant neoplasm, , ?lynch                         syndrome Providers:             Lin Landsman MD, MD Referring MD:          Barbaraann Faster. Ned Card (Referring MD) Medicines:             General Anesthesia Complications:         No immediate complications. Estimated blood loss: None. Procedure:             Pre-Anesthesia Assessment:                        - Prior to the procedure, a History and Physical was                         performed, and patient medications and allergies were                         reviewed. The patient is competent. The risks and                         benefits of the procedure and the sedation options and                         risks were discussed with the patient. All questions                         were answered and informed consent was obtained.                         Patient identification and proposed procedure were                         verified by the physician, the nurse, the                         anesthesiologist, the anesthetist and the technician                         in the pre-procedure area in the procedure room in the                         endoscopy suite. Mental Status Examination: alert and                         oriented. Airway Examination: normal oropharyngeal                         airway and neck mobility. Respiratory Examination:  clear to auscultation. CV Examination: normal.                         Prophylactic Antibiotics: The patient does not require                         prophylactic antibiotics. Prior  Anticoagulants: The                         patient has taken no previous anticoagulant or                         antiplatelet agents. ASA Grade Assessment: III - A                         patient with severe systemic disease. After reviewing                         the risks and benefits, the patient was deemed in                         satisfactory condition to undergo the procedure. The                         anesthesia plan was to use general anesthesia.                         Immediately prior to administration of medications,                         the patient was re-assessed for adequacy to receive                         sedatives. The heart rate, respiratory rate, oxygen                         saturations, blood pressure, adequacy of pulmonary                         ventilation, and response to care were monitored                         throughout the procedure. The physical status of the                         patient was re-assessed after the procedure.                        After obtaining informed consent, the colonoscope was                         passed under direct vision. Throughout the procedure,                         the patient's blood pressure, pulse, and oxygen                         saturations were monitored continuously. The  Colonoscope was introduced through the anus and                         advanced to the the cecum, identified by appendiceal                         orifice and ileocecal valve. The colonoscopy was                         performed without difficulty. The patient tolerated                         the procedure well. The quality of the bowel                         preparation was evaluated using the BBPS [facility] Bowel                         Preparation Scale) with scores of: Right Colon = 3,                         Transverse Colon = 3 and Left Colon = 3 (entire mucosa                         seen well with  no residual staining, small fragments                         of stool or opaque liquid). The total BBPS score                         equals 9. Findings:      The perianal and digital rectal examinations were normal. Pertinent       negatives include normal sphincter tone and no palpable rectal lesions.      Multiple large-mouthed diverticula were found in the recto-sigmoid       colon, sigmoid colon and descending colon. There was no evidence of       diverticular bleeding.      The retroflexed view of the distal rectum and anal verge was normal and       showed no anal or rectal abnormalities.      The exam was otherwise without abnormality. Impression:            - Severe diverticulosis in the recto-sigmoid colon, in                         the sigmoid colon and in the descending colon. There                         was no evidence of diverticular bleeding.                        - The distal rectum and anal verge are normal on                         retroflexion view.                        -  The examination was otherwise normal.                        - No specimens collected. Recommendation:        - Discharge patient to home (with escort).                        - Resume previous diet today.                        - Continue present medications.                        - Repeat colonoscopy in 3 - 5 years for surveillance.                        - Refer to a genetics counselor at appointment to be                         scheduled for ?lynch syndrome. Procedure Code(s):     --- Professional ---                        J3354, Colorectal cancer screening; colonoscopy on                         individual not meeting criteria for high risk Diagnosis Code(s):     --- Professional ---                        Z12.11, Encounter for screening for malignant neoplasm                         of colon                        K57.30, Diverticulosis of large intestine without                          perforation or abscess without bleeding CPT copyright 2019 American Medical Association. All rights reserved. The codes documented in this report are preliminary and upon coder review may  be revised to meet current compliance requirements. Dr. Ulyess Mort Lin Landsman MD, MD 09/11/2021 8:04:23 AM This report has been signed electronically. Number of Addenda: 0 Note Initiated On: 09/11/2021 7:04 AM Scope Withdrawal Time: 0 hours 4 minutes 33 seconds  Total Procedure Duration: 0 hours 7 minutes 53 seconds  Estimated Blood Loss:  Estimated blood loss: none.      [facility]

## 2021-09-11 NOTE — Telephone Encounter (Signed)
-----   Message from Lin Landsman, MD sent at 09/11/2021  8:45 AM EDT ----- Regarding: Referral Please refer her to genetic counselor at cancer center Dx; ?Lynch syndrome  Thanks RV

## 2021-09-11 NOTE — H&P (Signed)
Cephas Darby, MD [address]  [place]  [place], [place] [address]  Main: [phone]  Fax: [phone] Pager: [phone]  Primary Care Physician:  Venita Lick, NP Primary Gastroenterologist:  Dr. Cephas Darby  Pre-Procedure History & Physical: HPI:  Pamela Reid is a 65 y.o. female is here for an colonoscopy.   Past Medical History:  Diagnosis Date   Allergy    Cancer ([place])    GERD (gastroesophageal reflux disease)    Hypertension    Lynch syndrome     Past Surgical History:  Procedure Laterality Date   ABDOMINAL HYSTERECTOMY     CHOLECYSTECTOMY      Prior to Admission medications   Medication Sig Start Date End Date Taking? Authorizing Provider  esomeprazole (NEXIUM) 20 MG capsule Take by mouth. 05/08/13  Yes [provider]  losartan-hydrochlorothiazide (HYZAAR) 100-25 MG tablet Take 1 tablet by mouth daily. 05/03/21  Yes Cannady, Jolene T, NP  nebivolol (BYSTOLIC) 2.5 MG tablet TAKE 1 TABLET DAILY BY MOUTH FOR HYPERTENSION 05/03/21  Yes Cannady, Jolene T, NP  Aspirin 81 MG CAPS Take by mouth.    [provider]  Calcium Polycarbophil (FIBER) 625 MG TABS Take by mouth.    [provider]  Cholecalciferol 75 MCG (3000 UT) TABS Take by mouth.    [provider]  co-enzyme Q-10 30 MG capsule Take by mouth.    [provider]  ibuprofen (ADVIL) 200 MG tablet Take by mouth.    [provider]  Multiple Vitamin (MULTI-VITAMINS) TABS Take 1 tablet by mouth daily.    [provider]    Allergies as of 07/31/2021 - Review Complete 07/22/2021  Allergen Reaction Noted   Cephalexin Hives and Rash 12/31/2011   Penicillins Hives and Rash 12/31/2011    Family History  Problem Relation Age of Onset   Hypertension Mother    Parkinson's disease Father    Dementia Father    Healthy Sister    Healthy Brother    Healthy Daughter    Healthy Son    Cancer Maternal Grandmother    Cancer Maternal  Grandfather    Cancer Paternal Grandmother        breast cancer   Parkinson's disease Paternal Grandfather     Social History   Socioeconomic History   Marital status: Married    Spouse name: Not on file   Number of children: Not on file   Years of education: Not on file   Highest education level: Not on file  Occupational History   Not on file  Tobacco Use   Smoking status: Never   Smokeless tobacco: Never  Vaping Use   Vaping Use: Never used  Substance and Sexual Activity   Alcohol use: Yes    Alcohol/week: 1.0 standard drink    Types: 1 Glasses of wine per week    Comment: occasional   Drug use: Never   Sexual activity: Yes  Other Topics Concern   Not on file  Social History Narrative   Not on file   Social Determinants of Health   Financial Resource Strain: Low Risk    Difficulty of Paying Living Expenses: Not hard at all  Food Insecurity: No Food Insecurity   Worried About Charity fundraiser in the Last Year: Never true   [place] in the Last Year: Never true  Transportation Needs: No Transportation Needs   Lack of Transportation (Medical): No   Lack of Transportation (  Non-Medical): No  Physical Activity: Sufficiently Active   Days of Exercise per Week: 5 days   Minutes of Exercise per Session: 60 min  Stress: No Stress Concern Present   Feeling of Stress : Only a little  Social Connections: Moderately Isolated   Frequency of Communication with Friends and Family: More than three times a week   Frequency of Social Gatherings with Friends and Family: More than three times a week   Attends Religious Services: Never   Marine scientist or Organizations: No   Attends Music therapist: Never   Marital Status: Married  Human resources officer Violence: Not At Risk   Fear of Current or Ex-Partner: No   Emotionally Abused: No   Physically Abused: No   Sexually Abused: No    Review of Systems: See HPI, otherwise negative ROS  Physical  Exam: BP (!) 159/86   Pulse 78   Temp (!) 96.6 F (35.9 C) (Temporal)   Resp 18   Ht 5\' 3"  (1.6 m)   Wt 110.7 kg   SpO2 97%   BMI 43.22 kg/m  General:   Alert,  pleasant and cooperative in NAD Head:  Normocephalic and atraumatic. Neck:  Supple; no masses or thyromegaly. Lungs:  Clear throughout to auscultation.    Heart:  Regular rate and rhythm. Abdomen:  Soft, nontender and nondistended. Normal bowel sounds, without guarding, and without rebound.   Neurologic:  Alert and  oriented x4;  grossly normal neurologically.  Impression/Plan: Pamela Reid is here for an colonoscopy to be performed for colon cancer screening, ?lynch syndrome  Risks, benefits, limitations, and alternatives regarding  colonoscopy have been reviewed with the patient.  Questions have been answered.  All parties agreeable.   Sherri Sear, MD  09/11/2021, 7:43 AM

## 2021-09-12 ENCOUNTER — Encounter: Payer: Self-pay | Admitting: Gastroenterology

## 2021-09-19 ENCOUNTER — Encounter: Payer: Self-pay | Admitting: Licensed Clinical Social Worker

## 2021-09-19 ENCOUNTER — Other Ambulatory Visit: Payer: Self-pay

## 2021-09-19 ENCOUNTER — Inpatient Hospital Stay: Payer: Medicare Other | Attending: Oncology | Admitting: Licensed Clinical Social Worker

## 2021-09-19 ENCOUNTER — Inpatient Hospital Stay: Payer: Medicare Other

## 2021-09-19 DIAGNOSIS — Z803 Family history of malignant neoplasm of breast: Secondary | ICD-10-CM | POA: Diagnosis not present

## 2021-09-19 DIAGNOSIS — Z86010 Personal history of colonic polyps: Secondary | ICD-10-CM | POA: Diagnosis not present

## 2021-09-19 DIAGNOSIS — Z8542 Personal history of malignant neoplasm of other parts of uterus: Secondary | ICD-10-CM

## 2021-09-19 DIAGNOSIS — Z808 Family history of malignant neoplasm of other organs or systems: Secondary | ICD-10-CM | POA: Diagnosis not present

## 2021-09-19 NOTE — Progress Notes (Signed)
REFERRING PROVIDER: Lin Landsman, MD [address] [place],  [place] [address]  PRIMARY PROVIDER:  Venita Lick, NP  PRIMARY REASON FOR VISIT:  1. History of endometrial cancer   2. Personal history of colonic polyps   3. Family history of breast cancer   4. Family history of melanoma      HISTORY OF PRESENT ILLNESS:   Pamela Reid, a 65 y.o. female, was seen for a [facility] cancer genetics consultation at the request of Dr. Marius Ditch due to a personal history of cancer and reported Lynch syndrome diagnosis.  Pamela Reid presents to clinic today to discuss the possibility of a hereditary predisposition to cancer, genetic testing, and to further clarify her future cancer risks, as well as potential cancer risks for family members.   At the age of 76, Pamela Reid was diagnosed with endometrial cancer. This was treated with TAH-BSO in [place], [place] at the [facility]. 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 Patient does not have a copy of the report and the report cannot be located in her old records on [place]. However, several providers' notes mention a PMS2 mutation but the specific mutation/report is not available.  CANCER HISTORY:  Oncology History   No history exists.     RISK FACTORS:  Menarche was at age 22.  First live birth at age 44.  OCP use: yes Ovaries intact: no.  Hysterectomy: yes.  Menopausal status: postmenopausal.  Colonoscopy: yes;  has them every 2-3 years, she reports few polyps found and most recent one in 2022 was normal . Mammogram within the last year: no, but usually has them every year. Number of breast biopsies: 1.   Past Medical History:  Diagnosis Date   Allergy    Cancer ([place])    Family history of breast cancer    Family history of melanoma    GERD (gastroesophageal reflux disease)    Hypertension    Lynch syndrome     Past Surgical History:   Procedure Laterality Date   ABDOMINAL HYSTERECTOMY     CHOLECYSTECTOMY     COLONOSCOPY WITH PROPOFOL N/A 09/11/2021   Procedure: COLONOSCOPY WITH PROPOFOL;  Surgeon: Lin Landsman, MD;  Location: [facility] ENDOSCOPY;  Service: Gastroenterology;  Laterality: N/A;    Social History   Socioeconomic History   Marital status: Married    Spouse name: Not on file   Number of children: Not on file   Years of education: Not on file   Highest education level: Not on file  Occupational History   Not on file  Tobacco Use   Smoking status: Never   Smokeless tobacco: Never  Vaping Use   Vaping Use: Never used  Substance and Sexual Activity   Alcohol use: Yes    Alcohol/week: 1.0 standard drink    Types: 1 Glasses of wine per week    Comment: occasional   Drug use: Never   Sexual activity: Yes  Other Topics Concern   Not on file  Social History Narrative   Not on file   Social Determinants of Health   Financial Resource Strain: Low Risk    Difficulty of Paying Living Expenses: Not hard at all  Food Insecurity: No Food Insecurity   Worried About Charity fundraiser in the Last Year: Never true   [place] in the Last Year: Never true  Transportation  Needs: No Transportation Needs   Lack of Transportation (Medical): No   Lack of Transportation (Non-Medical): No  Physical Activity: Sufficiently Active   Days of Exercise per Week: 5 days   Minutes of Exercise per Session: 60 min  Stress: No Stress Concern Present   Feeling of Stress : Only a little  Social Connections: Moderately Isolated   Frequency of Communication with Friends and Family: More than three times a week   Frequency of Social Gatherings with Friends and Family: More than three times a week   Attends Religious Services: Never   Marine scientist or Organizations: No   Attends Music therapist: Never   Marital Status: Married     FAMILY HISTORY:  We obtained a detailed, 4-generation  family history.  Significant diagnoses are listed below: Family History  Problem Relation Age of Onset   Hypertension Mother    Melanoma Mother    Parkinson's disease Father    Dementia Father    Healthy Sister    Healthy Brother    Breast cancer Maternal Aunt        dx 48s   Melanoma Paternal Aunt    Cancer Maternal Grandmother        "GI cancer" dx 71s   Cancer Maternal Grandfather        "GI cancer" dx 56s   Breast cancer Paternal Grandmother        dx 22s   Parkinson's disease Paternal Grandfather    Healthy Daughter    Healthy Son    [name] has 1 son (66) and 1 daughter (78). Neither have had cancer or genetic testing. She has 1 brother (7) and 1 sister (25), also have not had cancer and do not want to have genetic testing.   Pamela Reid mother is living at 52. She has had some skin cancers/melanomas removed, but otherwise no cancers. She did have a hysterectomy in her 34s for bleeding. Patient had 4 maternal aunts, 2 uncles. One aunt died of metastatic breast cancer at 10. Both maternal grandparents died of GI cancers in their 57s.  Pamela Reid father died at 28 of Parkinson's and dementia. Patient had 2 paternal aunts. One has had melanoma but otherwise no cancers. Paternal grandmother had breast cancer in her 93s and died of dementia. Grandfather died at 26 of Parkinson's.  Pamela Reid is aware of previous family history of genetic testing for hereditary cancer risks. Patient's maternal ancestors are of European descent, and paternal ancestors are of European/English descent. There is no reported Ashkenazi Jewish ancestry. There is no known consanguinity.    GENETIC COUNSELING ASSESSMENT: Ms. Dubach is a 65 y.o. 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 We, therefore, discussed and recommended the following at today's visit.   DISCUSSION: We discussed that approximately 10% of cancer is hereditary. We discussed the PMS2 gene, noting that it  would be important to see a copy of her old test report to confirm the Lynch syndrome diagnosis given to her 10 years ago. Patient believes it will be very difficult to obtain a copy, and after a thorough chart review I was unable to find a copy in her old records in [place]. We discussed that she could try to obtain a copy, or we could update her testing today. We discussed that testing is beneficial for several reasons including nowing about other cancer risks, identifying potential screening and risk-reduction options that may be appropriate, and to understand  if other family members could be at risk for cancer and allow them to undergo genetic testing.   We reviewed the characteristics, features and inheritance patterns of hereditary cancer syndromes. We also discussed genetic testing, including the appropriate family members to test, the process of testing, insurance coverage and turn-around-time for results. We discussed the implications of a negative, positive and/or variant of uncertain significant result. We recommended Ms. Weiskopf pursue genetic testing for the [facility] Multi-Cancer+RNA gene panel.   The Multi-Cancer Panel + RNA offered by [facility] includes sequencing and/or deletion duplication testing of the following 84 genes: AIP, ALK, APC, ATM, AXIN2,BAP1,  BARD1, BLM, BMPR1A, BRCA1, BRCA2, BRIP1, CASR, CDC73, CDH1, CDK4, CDKN1B, CDKN1C, CDKN2A (p14ARF), CDKN2A (p16INK4a), CEBPA, CHEK2, CTNNA1, DICER1, DIS3L2, EGFR (c.2369C>T, p.Thr790Met variant only), EPCAM (Deletion/duplication testing only), FH, FLCN, GATA2, GPC3, GREM1 (Promoter region deletion/duplication testing only), HOXB13 (c.251G>A, p.Gly84Glu), HRAS, KIT, MAX, MEN1, MET, MITF (c.952G>A, p.Glu318Lys variant only), MLH1, MSH2, MSH3, MSH6, MUTYH, NBN, NF1, NF2, NTHL1, PALB2, PDGFRA, PHOX2B, PMS2, POLD1, POLE, POT1, PRKAR1A, PTCH1, PTEN, RAD50, RAD51C, RAD51D, RB1, RECQL4, RET, RUNX1, SDHAF2, SDHA (sequence changes only), SDHB, SDHC, SDHD, SMAD4,  SMARCA4, SMARCB1, SMARCE1, STK11, SUFU, TERC, TERT, TMEM127, TP53, TSC1, TSC2, VHL, WRN and WT1.  Based on Ms. Swinson's personal history of cancer, she meets medical criteria for genetic testing. Despite that she meets criteria, she may still have an out of pocket cost.   PLAN: After considering the risks, benefits, and limitations, Ms. Herrero provided informed consent to pursue genetic testing and the blood sample was sent to [facility] for analysis of the Multi-Cancer+RNA Panel. Results should be available within approximately 2-3 weeks' time, at which point they will be disclosed by telephone to Ms. Trainer, as will any additional recommendations warranted by these results. Ms. Lovan will receive a summary of her genetic counseling visit and a copy of her results once available. This information will also be available in Epic.   Ms. Shockley questions were answered to her satisfaction today. Our contact information was provided should additional questions or concerns arise. Thank you for the referral and allowing [place] to share in the care of your patient.   Faith Rogue, MS, [facility] Genetic Counselor [email or web address] Phone: [phone]  The patient was seen for a total of 20 minutes in face-to-face genetic counseling.  Patient was seen with her husband, Elta Guadeloupe. Dr. Grayland Ormond was available for discussion regarding this case.   _______________________________________________________________________ For Office Staff:  Number of people involved in session: 2 Was an Intern/ student involved with case: no

## 2021-10-09 ENCOUNTER — Telehealth: Payer: Self-pay | Admitting: Licensed Clinical Social Worker

## 2021-10-16 ENCOUNTER — Encounter: Payer: Self-pay | Admitting: Gastroenterology

## 2021-10-16 ENCOUNTER — Encounter: Payer: Self-pay | Admitting: Licensed Clinical Social Worker

## 2021-10-16 ENCOUNTER — Ambulatory Visit: Payer: Self-pay | Admitting: Licensed Clinical Social Worker

## 2021-10-16 DIAGNOSIS — Z1379 Encounter for other screening for genetic and chromosomal anomalies: Secondary | ICD-10-CM

## 2021-10-16 DIAGNOSIS — Z1509 Genetic susceptibility to other malignant neoplasm: Secondary | ICD-10-CM

## 2021-10-16 DIAGNOSIS — Z86010 Personal history of colonic polyps: Secondary | ICD-10-CM

## 2021-10-16 DIAGNOSIS — Z803 Family history of malignant neoplasm of breast: Secondary | ICD-10-CM

## 2021-10-16 DIAGNOSIS — Z808 Family history of malignant neoplasm of other organs or systems: Secondary | ICD-10-CM

## 2021-10-16 NOTE — Telephone Encounter (Signed)
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 Discussed these results in detail, she is already followed for this indication.

## 2021-10-16 NOTE — Progress Notes (Signed)
Genetic Test Results  HPI:  Ms. Dercole was previously seen in the [place] clinic due to a personal and family history of cancer and concerns regarding a hereditary predisposition to cancer. Please refer to our prior cancer genetics clinic note for more information regarding our discussion, assessment and recommendations, at the time. Ms. Thom recent genetic test results were disclosed to her, as were recommendations warranted by these results. These results and recommendations are discussed in more detail below.  At the age of 65, Ms. Esteve was diagnosed with endometrial cancer. This was treated with TAH-BSO in [place], [place] at the [facility]. 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 Patient does not have a copy of the report and the report cannot be located in her old records on [place]. However, several providers' notes mention a PMS2 mutation but the specific mutation/report is not available.  CANCER HISTORY:  Oncology History   No history exists.    FAMILY HISTORY:  We obtained a detailed, 4-generation family history.  Significant diagnoses are listed below: Family History  Problem Relation Age of Onset   Hypertension Mother    Melanoma Mother    Parkinson's disease Father    Dementia Father    Healthy Sister    Healthy Brother    Breast cancer Maternal Aunt        dx 51s   Melanoma Paternal Aunt    Cancer Maternal Grandmother        "GI cancer" dx 66s   Cancer Maternal Grandfather        "GI cancer" dx 58s   Breast cancer Paternal Grandmother        dx 68s   Parkinson's disease Paternal Grandfather    Healthy Daughter    Healthy Son      Ms. Dollinger has 1 son (35) and 1 daughter (67). Neither have had cancer or genetic testing. She has 1 brother (58) and 1 sister (70), also have not had cancer and do not want to have genetic testing.    Ms. Brothers mother is  living at 14. She has had some skin cancers/melanomas removed, but otherwise no cancers. She did have a hysterectomy in her 57s for bleeding. Patient had 4 maternal aunts, 2 uncles. One aunt died of metastatic breast cancer at 12. Both maternal grandparents died of GI cancers in their 64s.   Ms. Kalish father died at 100 of Parkinson's and dementia. Patient had 2 paternal aunts. One has had melanoma but otherwise no cancers. Paternal grandmother had breast cancer in her 50s and died of dementia. Grandfather died at 2 of Parkinson's.   Ms. Porras is aware of previous family history of genetic testing for hereditary cancer risks. Patient's maternal ancestors are of European descent, and paternal ancestors are of European/English descent. There is no reported Ashkenazi Jewish ancestry. There is no known consanguinity.     GENETIC TEST RESULTS: Genetic testing reported out on 10/07/2021 through the [facility] Multi-Cancer+RNA cancer panel found a single, pathogenic variant in PMS2 called c.1A>G. The remainder of testing was negative/normal.   The Multi-Cancer Panel + RNA offered by [facility] includes sequencing and/or deletion duplication testing of the following 84 genes: AIP, ALK, APC, ATM, AXIN2,BAP1,  BARD1, BLM, BMPR1A, BRCA1, BRCA2, BRIP1, CASR, CDC73, CDH1, CDK4, CDKN1B, CDKN1C, CDKN2A (p14ARF), CDKN2A (p16INK4a), CEBPA, CHEK2, CTNNA1, DICER1, DIS3L2, EGFR (c.2369C>T, p.Thr790Met variant only), EPCAM (Deletion/duplication testing only),  FH, FLCN, GATA2, GPC3, GREM1 (Promoter region deletion/duplication testing only), HOXB13 (c.251G>A, p.Gly84Glu), HRAS, KIT, MAX, MEN1, MET, MITF (c.952G>A, p.Glu318Lys variant only), MLH1, MSH2, MSH3, MSH6, MUTYH, NBN, NF1, NF2, NTHL1, PALB2, PDGFRA, PHOX2B, PMS2, POLD1, POLE, POT1, PRKAR1A, PTCH1, PTEN, RAD50, RAD51C, RAD51D, RB1, RECQL4, RET, RUNX1, SDHAF2, SDHA (sequence changes only), SDHB, SDHC, SDHD, SMAD4, SMARCA4, SMARCB1, SMARCE1, STK11, SUFU, TERC, TERT, TMEM127, TP53,  TSC1, TSC2, VHL, WRN and WT1.  The test report has been scanned into EPIC and is located under the Molecular Pathology section of the Results Review tab.  A portion of the result report is included below for reference.     VUS in BLM called c.1741A>G  also identified. At this time, it is unknown if this variant is associated with increased cancer risk or if this is a normal finding, but most variants such as this get reclassified to being inconsequential. It should not be used to make medical management decisions. With time, we suspect the lab will determine the significance of this variant, if any. If we do learn more about it we will try to contact Ms. Chanthavong to discuss it further. However, it is important to stay in touch with [place] periodically and keep the address and phone number up to date.  DISCUSSION: PMS2 (Lynch syndrome)   We discussed the cancers, inheritance, management asscociated with MSH2 and the importance of telling family members about this result.   Clinical condition Lynch syndrome is characterized by an increased risk of colorectal cancer as well as cancers of the endometrium, ovary, prostate, stomach, small intestine, hepatobiliary tract, urinary tract, pancreas and brain (PMID: 3662947, 65465035, 46568127). Lynch syndrome tumors typically demonstrate microsatellite instability (MSI) as well as loss of expression of the mismatch repair proteins on immunohistochemical (IHC) staining. This condition is caused by pathogenic variants in one of the mismatch repair genes--MLH1, MSH2, MSH6, PMS2--as well as deletions in the 3' end of the EPCAM gene.  Inheritance Lynch syndrome has autosomal dominant inheritance. This means that an individual with a pathogenic variant has a 50% chance of passing the condition on to their offspring. Once a pathogenic mutation is detected in an individual, it is possible to identify at-risk relatives who can pursue testing for this specific familial variant.  Many cases are inherited from a parent, but some can occur spontaneously (i.e., an individual with a pathogenic variant has parents who do not have it); however, that individual now has a 50% risk of passing it on to future offspring.  Additionally, individuals with a pathogenic variant in one of the MMR genes (MLH1, MSH2, MSH6, PMS2) are carriers of constitutional mismatch repair deficiency (CMMR-D). CMMR-D is a childhood-onset cancer predisposition syndrome that can present with hematological malignancies, cancers of the brain and central nervous system, Lynch syndrome-associated cancers (colon, uterine, small bowel, urinary tract), embryonic tumors, and sarcomas. Some affected individuals may also display some features of neurofibromatosis type 1--most often cafe-au-lait macules (PMID: 51700174, 94496759). For there to be a risk of CMMR-D in offspring, an individual and their partner would each have to have a single pathogenic variant in the same MMR gene; in such a case, the risk of having an affected child is 25%.  Management:      Colorectal cancer -High-quality colonoscopy at 30-35 or 2-5 years prior to earliest colon cancer if it is diagnosed before age 61 and repeat every 1-2 years -There are data demonstrating that use of 600 mg/daily of aspirin for at least 2 years decreases CRC risk in  LS, decision to use aspirin should be individualized  Endometrial cancer -PMS2 carriers appear to be at only modestly increased risk of endometrial cancer compared to other Lynch syndrome genes -Women should be educated on prompt reporting and evaluation of abnormal uterine bleeding/postmenopausal bleeding, evaluation should include endometrial biopsy -Hysterectomy has not been shown to reduce endometrial cancer mortality, but can reduce incidence of endometrial cancer. Therefore hysterectomy can be considered, timing can be individualized. -Endometrial cancer screening does not have proven benefit in women  with LS, however endometrial biopsy is both highly sensitive and specific, screening via endometrial biopsy can be considered every 1-2 years at age 55-35 can be considered. -Transvaginal ultrasound to screen for endometrial cancer in postmenopausal women has not been shown to be sufficiently sensitive or specific as to support a positive recommendation, but may be considered at clinician's discretion.   Ovarian cancer -Insufficient evidence exists to make a recommendation for RRSO for PMS2 pathogenic variant carriers -PMS2 carriers appear to be at no greater than average risk for ovarian cancer -Bilateral salpingo-oophorectomy may reduce incidence of ovarian cancer. The decision to have BSO and timing should be individualized -Since there is no effective screening for ovarian cancer, women should be educated on symptoms that may be associated such as pelvic or abdominal pain, bloating, increased abdominal girth, difficulty eating, early satiety, or urinary frequency or urgency. Symptoms that persist for several weeks and are a change from woman's baseline should prompt evaluation by physician.  -Data do not support routine ovarian cancer screening for LS. Transvaginal ultrasound may be considered, serum CA-125 may be considered at clinician's discretion -Consider risk-reducing agents for endometrial and ovarian cancers  Urothelial cancer (Renal pelvis, ureter and/or bladder) -No clear evidence to support surveillance for urothelial cancers in LS -Surveillance may be considered in selected individuals such as family history of urothelial cancer, individuals with MSH2 mutations appear to be at higher risk -Surveillance options may include urinalysis starting at 30-35  Gastric and small bowel cancer -Consider EGD with random biopsy of the proximal and distal stomach for H. Pylori, autoimmune gastritis, and intestinal metaplasia beginning at age 76- 62 y and surveillance EGD every 2-4 years    Pancreatic cancer -PMS2 carriers have not been shown to be at increased risk for pancreatic tumors -Consider pancreatic cancer screening beginning at 75 (or 10 years younger than earliest exocrine pancreatic cancer diagnosis in the family, whichever is earlier) for individuals with exocrine pancreatic cancer in >1 first or second degree relative from the same side of the family as the identified germline variant.   Prostate cancer -Insufficient evidence to recommend earlier or more frequent prostate cancer screening   Brain cancer -Consider annual physical/ neurologic examination starting at 25-30y  These guidelines are based on current NCCN guidelines (NCCN v.1.2022).  These guidelines are subject to change and continually updated and should be directly referenced for future medical management.     An individual's cancer risk and medical management are not determined by genetic test results alone. Overall cancer risk assessment incorporates additional factors, including personal medical history, family history, and any available genetic information that may result in a personalized plan for cancer prevention and surveillance.  Knowing if a pathogenic PMS2 variant is present is advantageous. At-risk relatives can be identified, enabling pursuit of a diagnostic evaluation. Information regarding hereditary cancer susceptibility genes is constantly evolving, and more clinically relevant data regarding ATM is likely to become available in the near future. Awareness of this cancer predisposition encourages patients and  their providers to inform at-risk family members, to diligently follow condition-specific screening protocols, and to be vigilant in maintaining close and regular contact with their local genetics clinic in anticipation of new information.   FAMILY MEMBERS: It is important that all of Ms. Antony's relatives (both men and women) know of the presence of this gene mutation. Site-specific  genetic testing can sort out who in the family is at risk and who is not.   Ms. Timm children and siblings have a 50% chance to have inherited this mutation. We recommend they have genetic testing for this same mutation, as identifying the presence of this mutation would allow them to also take advantage of risk-reducing measures. Ms. Tessier has discussed testing with her siblings and children in the past and they were not interested.  PLAN:   Ms. Jensen will need to be followed as high risk based on her diagnosis of Lynch syndrome.    Ms. Strum has identified a gastroenterologist that she plans to see for follow up for her diagnosis of Lynch syndrome.  This individual is Dr. Marius Ditch. She would also like to be followed by her PCP Dr. Ned Card.    We encouraged Ms. Kirchgessner to remain in contact with [place] on an annual basis so we can update her personal and family histories, and let her know of advances in cancer genetics that may benefit the family. Our contact number was provided. Ms. Gaubert questions were answered to her satisfaction today, and she knows she is welcome to call anytime with additional questions.   Faith Rogue, MS, [facility] Genetic Counselor [email or web address] Phone: [phone]

## 2021-10-17 ENCOUNTER — Telehealth: Payer: Self-pay

## 2021-10-17 NOTE — Telephone Encounter (Signed)
Since she had an EGD within last 4 years and it was normal, we can hold off on repeating it.  I will see her for follow-up  RV

## 2021-10-17 NOTE — Telephone Encounter (Signed)
Spoke with patient she doesn't not want to schedule egd right now she wants to wait because last egd showed nothing so patient doesn't feel she needs another one right now

## 2021-12-30 ENCOUNTER — Ambulatory Visit
Admission: RE | Admit: 2021-12-30 | Discharge: 2021-12-30 | Disposition: A | Discharge status: Home / Self Care | Payer: Medicare Other | Source: Ambulatory Visit | Attending: Nurse Practitioner | Admitting: Nurse Practitioner

## 2021-12-30 ENCOUNTER — Other Ambulatory Visit: Payer: Self-pay

## 2021-12-30 DIAGNOSIS — Z780 Asymptomatic menopausal state: Secondary | ICD-10-CM | POA: Diagnosis not present

## 2021-12-30 DIAGNOSIS — Z1231 Encounter for screening mammogram for malignant neoplasm of breast: Secondary | ICD-10-CM | POA: Insufficient documentation

## 2021-12-30 NOTE — Progress Notes (Signed)
Contacted via MyChart   Normal mammogram, may repeat in one year:)

## 2021-12-30 NOTE — Progress Notes (Signed)
Contacted via [place] afternoon [place], your bone density did return showing normal findings.  Although this is normal, I do recommend we repeat this in 10 years to ensure ongoing normal bone density levels.  I also recommend ensuring good Vitamin D and calcium intake daily for bone health.  Any questions? Keep being amazing!!  Thank you for allowing me to participate in your care.  I appreciate you. Kindest regards, Rhyan Radler

## 2022-01-02 ENCOUNTER — Encounter: Payer: Self-pay | Admitting: Nurse Practitioner

## 2022-01-19 NOTE — Patient Instructions (Signed)

## 2022-01-22 ENCOUNTER — Other Ambulatory Visit: Payer: Self-pay

## 2022-01-22 ENCOUNTER — Encounter: Payer: Self-pay | Admitting: Nurse Practitioner

## 2022-01-22 ENCOUNTER — Ambulatory Visit (INDEPENDENT_AMBULATORY_CARE_PROVIDER_SITE_OTHER): Payer: Medicare Other | Admitting: Nurse Practitioner

## 2022-01-22 VITALS — BP 131/78 | HR 66 | Temp 98.4°F | Ht 63.0 in | Wt 254.6 lb

## 2022-01-22 DIAGNOSIS — Z6841 Body Mass Index (BMI) 40.0 and over, adult: Secondary | ICD-10-CM

## 2022-01-22 DIAGNOSIS — K219 Gastro-esophageal reflux disease without esophagitis: Secondary | ICD-10-CM

## 2022-01-22 DIAGNOSIS — Z808 Family history of malignant neoplasm of other organs or systems: Secondary | ICD-10-CM | POA: Diagnosis not present

## 2022-01-22 DIAGNOSIS — Z1509 Genetic susceptibility to other malignant neoplasm: Secondary | ICD-10-CM | POA: Diagnosis not present

## 2022-01-22 DIAGNOSIS — I10 Essential (primary) hypertension: Secondary | ICD-10-CM

## 2022-01-22 DIAGNOSIS — Z803 Family history of malignant neoplasm of breast: Secondary | ICD-10-CM

## 2022-01-22 DIAGNOSIS — E7800 Pure hypercholesterolemia, unspecified: Secondary | ICD-10-CM

## 2022-01-22 NOTE — Progress Notes (Signed)
BP 131/78    Pulse 66    Temp 98.4 F (36.9 C)    Ht _0  (1.6 m)    Wt 254 lb 9.6 oz (115.5 kg)    SpO2 96%    BMI 45.10 kg/m    Subjective:    Patient ID: Pamela Reid, female    DOB: January 07, 1956, 66 y.o.   MRN: [id number]  HPI: Pamela Reid is a 66 y.o. female  Chief Complaint  Patient presents with   Hypertension    Patient is here for follow up on Hypertension. Patient denies having any concerns at today's visit.    HYPERTENSION Continues on Losartan-HCTZ 144-31 MG and Bystolic 2.5 MG daily. Has been on these for >5 years, diagnosed with HTN in her 65's.   Has Lynch syndrome, went for genetic counseling 10/16/21 -- is to schedule her next EGD in 2 years and had recent colonoscopy October 2022.  Has family history of melanoma -- has not seen a dermatologist in 5-6 years. Hypertension status: stable  Satisfied with current treatment? yes Duration of hypertension: chronic BP monitoring frequency:  a few times a month BP range:  120-130/high 70's to low 80's BP medication side effects:  no Medication compliance: good compliance Aspirin: yes Recurrent headaches: no Visual changes: no Palpitations: no Dyspnea: no Chest pain: no Lower extremity edema: no Dizzy/lightheaded: no  The 10-year ASCVD risk score (Arnett DK, et al., 2019) is: 8.2%   Values used to calculate the score:     Age: 48 years     Sex: Female     Is Non-Hispanic African American: No     Diabetic: No     Tobacco smoker: No     Systolic Blood Pressure: 540 mmHg     Is BP treated: Yes     HDL Cholesterol: 71 mg/dL     Total Cholesterol: 218 mg/dL   Relevant past medical, surgical, family and social history reviewed and updated as indicated. Interim medical history since our last visit reviewed. Allergies and medications reviewed and updated.  Review of Systems  Constitutional:  Negative for activity change, appetite change, diaphoresis, fatigue and fever.  Respiratory:  Negative for cough, chest  tightness and shortness of breath.   Cardiovascular:  Negative for chest pain, palpitations and leg swelling.  Gastrointestinal: Negative.   Endocrine: Negative for cold intolerance, heat intolerance, polydipsia, polyphagia and polyuria.  Neurological: Negative.   Psychiatric/Behavioral: Negative.     Per HPI unless specifically indicated above     Objective:    BP 131/78    Pulse 66    Temp 98.4 F (36.9 C)    Ht _1  (1.6 m)    Wt 254 lb 9.6 oz (115.5 kg)    SpO2 96%    BMI 45.10 kg/m   Wt Readings from Last 3 Encounters:  01/22/22 254 lb 9.6 oz (115.5 kg)  09/11/21 244 lb (110.7 kg)  07/22/21 241 lb 12.8 oz (109.7 kg)    Physical Exam Vitals and nursing note reviewed.  Constitutional:      General: She is awake. She is not in acute distress.    Appearance: She is well-developed and well-groomed. She is obese. She is not ill-appearing or toxic-appearing.  HENT:     Head: Normocephalic.     Right Ear: Hearing normal.     Left Ear: Hearing normal.  Eyes:     General: Lids are normal.        Right eye:  No discharge.        Left eye: No discharge.     Conjunctiva/sclera: Conjunctivae normal.     Pupils: Pupils are equal, round, and reactive to light.  Neck:     Thyroid: No thyromegaly.     Vascular: No carotid bruit or JVD.  Cardiovascular:     Rate and Rhythm: Normal rate and regular rhythm.     Heart sounds: Normal heart sounds. No murmur heard.   No gallop.  Pulmonary:     Effort: Pulmonary effort is normal. No accessory muscle usage or respiratory distress.     Breath sounds: Normal breath sounds.  Abdominal:     General: Bowel sounds are normal.     Palpations: Abdomen is soft.  Musculoskeletal:     Cervical back: Normal range of motion and neck supple.     Right lower leg: No edema.     Left lower leg: No edema.  Lymphadenopathy:     Cervical: No cervical adenopathy.  Skin:    General: Skin is warm and dry.  Neurological:     Mental Status: She is alert  and oriented to person, place, and time.     Deep Tendon Reflexes:     Reflex Scores:      Brachioradialis reflexes are 1+ on the right side and 1+ on the left side.      Patellar reflexes are 1+ on the right side and 1+ on the left side. Psychiatric:        Attention and Perception: Attention normal.        Mood and Affect: Mood normal.        Speech: Speech normal.        Behavior: Behavior normal. Behavior is cooperative.        Thought Content: Thought content normal.    Results for orders placed or performed in visit on 07/22/21  CBC with Differential/Platelet  Result Value Ref Range   WBC 6.8 3.4 - 10.8 x10E3/uL   RBC 4.78 3.77 - 5.28 x10E6/uL   Hemoglobin 14.4 11.1 - 15.9 g/dL   Hematocrit 42.8 34.0 - 46.6 %   MCV 90 79 - 97 fL   MCH 30.1 26.6 - 33.0 pg   MCHC 33.6 31.5 - 35.7 g/dL   RDW 12.1 11.7 - 15.4 %   Platelets 279 150 - 450 x10E3/uL   Neutrophils 76 Not Estab. %   Lymphs 15 Not Estab. %   Monocytes 7 Not Estab. %   Eos 2 Not Estab. %   Basos 0 Not Estab. %   Neutrophils Absolute 5.1 1.4 - 7.0 x10E3/uL   Lymphocytes Absolute 1.0 0.7 - 3.1 x10E3/uL   Monocytes Absolute 0.5 0.1 - 0.9 x10E3/uL   EOS (ABSOLUTE) 0.1 0.0 - 0.4 x10E3/uL   Basophils Absolute 0.0 0.0 - 0.2 x10E3/uL   Immature Granulocytes 0 Not Estab. %   Immature Grans (Abs) 0.0 0.0 - 0.1 x10E3/uL  Comprehensive metabolic panel  Result Value Ref Range   Glucose 101 (H) 65 - 99 mg/dL   BUN 11 8 - 27 mg/dL   Creatinine, Ser 0.65 0.57 - 1.00 mg/dL   eGFR 97 >59 mL/min/1.73   BUN/Creatinine Ratio 17 12 - 28   Sodium 140 134 - 144 mmol/L   Potassium 4.0 3.5 - 5.2 mmol/L   Chloride 99 96 - 106 mmol/L   CO2 21 20 - 29 mmol/L   Calcium 9.6 8.7 - 10.3 mg/dL   Total Protein 7.1 6.0 -  8.5 g/dL   Albumin 4.2 3.8 - 4.8 g/dL   Globulin, Total 2.9 1.5 - 4.5 g/dL   Albumin/Globulin Ratio 1.4 1.2 - 2.2   Bilirubin Total 0.6 0.0 - 1.2 mg/dL   Alkaline Phosphatase 127 (H) 44 - 121 IU/L   AST 17 0 - 40 IU/L    ALT 18 0 - 32 IU/L  Lipid Panel w/o Chol/HDL Ratio  Result Value Ref Range   Cholesterol, Total 218 (H) 100 - 199 mg/dL   Triglycerides 155 (H) 0 - 149 mg/dL   HDL 71 >39 mg/dL   VLDL Cholesterol Cal 27 5 - 40 mg/dL   LDL Chol Calc (NIH) 120 (H) 0 - 99 mg/dL  TSH  Result Value Ref Range   TSH 3.190 0.450 - 4.500 uIU/mL      Assessment & Plan:   Problem List Items Addressed This Visit       Cardiovascular and Mediastinum   Hypertension, essential    Chronic, stable with BP at goal today on current medications.  Recommend she monitor BP at least a few mornings a week at home and document.  DASH diet at home.  Continue current medication regimen and adjust as needed.  Refills as needed.  Labs outpatient.      Relevant Orders   CBC with Differential/Platelet   Comprehensive metabolic panel     Digestive   GERD (gastroesophageal reflux disease)    Ongoing with use of Nexium.  Continue to monitor and consider annual Mag level if taking Nexium consistently.          Other   Elevated low density lipoprotein (LDL) cholesterol level    Noted on labs 2019 and recent labs with LDL <190, ASCVD 8.2%.  Continue diet and exercise focus, will plan on recheck on labs fasting outpatient.      Relevant Orders   Lipid Panel w/o Chol/HDL Ratio   Family history of breast cancer    Mammogram up to date and normal.      Family history of melanoma    Referral to dermatology for annual skin exams.      Relevant Orders   Ambulatory referral to Dermatology   Obesity - Primary    BMI 45.10.  Recommended eating smaller high protein, low fat meals more frequently and exercising 30 mins a day 5 times a week with a goal of 10-15lb weight loss in the next 3 months. Patient voiced their understanding and motivation to adhere to these recommendations.       PMS2-related Lynch syndrome (HNPCC4)    Genetic testing on 10/16/21 confirmed this.  Continue to follow with GI for scheduled EGD and  colonoscopies per their recommended time periods.      Relevant Orders   Amylase   Comprehensive metabolic panel   Lipase   Urinalysis, Routine w reflex microscopic   Ambulatory referral to Dermatology     Follow up plan: Return in about 26 weeks (around 07/23/2022) for Annual physical.

## 2022-01-22 NOTE — Assessment & Plan Note (Signed)
Genetic testing on 10/16/21 confirmed this.  Continue to follow with GI for scheduled EGD and colonoscopies per their recommended time periods.

## 2022-01-22 NOTE — Assessment & Plan Note (Signed)
Chronic, stable with BP at goal today on current medications.  Recommend she monitor BP at least a few mornings a week at home and document.  DASH diet at home.  Continue current medication regimen and adjust as needed.  Refills as needed.  Labs outpatient.

## 2022-01-22 NOTE — Assessment & Plan Note (Signed)
Noted on labs 2019 and recent labs with LDL <190, ASCVD 8.2%.  Continue diet and exercise focus, will plan on recheck on labs fasting outpatient.

## 2022-01-22 NOTE — Assessment & Plan Note (Signed)
Mammogram up to date and normal. 

## 2022-01-22 NOTE — Assessment & Plan Note (Signed)
BMI 45.10.  Recommended eating smaller high protein, low fat meals more frequently and exercising 30 mins a day 5 times a week with a goal of 10-15lb weight loss in the next 3 months. Patient voiced their understanding and motivation to adhere to these recommendations.

## 2022-01-22 NOTE — Assessment & Plan Note (Signed)
Referral to dermatology for annual skin exams.

## 2022-01-22 NOTE — Assessment & Plan Note (Signed)
Ongoing with use of Nexium.  Continue to monitor and consider annual Mag level if taking Nexium consistently.

## 2022-02-19 ENCOUNTER — Other Ambulatory Visit: Payer: Self-pay

## 2022-02-19 DIAGNOSIS — E7800 Pure hypercholesterolemia, unspecified: Secondary | ICD-10-CM | POA: Diagnosis not present

## 2022-02-19 DIAGNOSIS — Z1509 Genetic susceptibility to other malignant neoplasm: Secondary | ICD-10-CM

## 2022-02-19 DIAGNOSIS — I10 Essential (primary) hypertension: Secondary | ICD-10-CM | POA: Diagnosis not present

## 2022-02-20 LAB — COMPREHENSIVE METABOLIC PANEL
ALT: 13 IU/L (ref 0–32)
AST: 15 IU/L (ref 0–40)
Albumin/Globulin Ratio: 1.8 (ref 1.2–2.2)
Albumin: 4.3 g/dL (ref 3.8–4.8)
Alkaline Phosphatase: 96 IU/L (ref 44–121)
BUN/Creatinine Ratio: 16 (ref 12–28)
BUN: 10 mg/dL (ref 8–27)
Bilirubin Total: 0.6 mg/dL (ref 0.0–1.2)
CO2: 26 mmol/L (ref 20–29)
Calcium: 9.6 mg/dL (ref 8.7–10.3)
Chloride: 101 mmol/L (ref 96–106)
Creatinine, Ser: 0.62 mg/dL (ref 0.57–1.00)
Globulin, Total: 2.4 g/dL (ref 1.5–4.5)
Glucose: 114 mg/dL — ABNORMAL HIGH (ref 70–99)
Potassium: 4.4 mmol/L (ref 3.5–5.2)
Sodium: 141 mmol/L (ref 134–144)
Total Protein: 6.7 g/dL (ref 6.0–8.5)
eGFR: 98 mL/min/{1.73_m2} (ref >59)

## 2022-02-20 LAB — AMYLASE: Amylase: 51 U/L (ref 31–110)

## 2022-02-20 LAB — CBC WITH DIFFERENTIAL/PLATELET
Basophils Absolute: 0 10*3/uL (ref 0.0–0.2)
Basos: 1 %
EOS (ABSOLUTE): 0.1 10*3/uL (ref 0.0–0.4)
Eos: 2 %
Hematocrit: 39.2 % (ref 34.0–46.6)
Hemoglobin: 13.5 g/dL (ref 11.1–15.9)
Immature Grans (Abs): 0 10*3/uL (ref 0.0–0.1)
Immature Granulocytes: 0 %
Lymphocytes Absolute: 1 10*3/uL (ref 0.7–3.1)
Lymphs: 16 %
MCH: 30.4 pg (ref 26.6–33.0)
MCHC: 34.4 g/dL (ref 31.5–35.7)
MCV: 88 fL (ref 79–97)
Monocytes Absolute: 0.5 10*3/uL (ref 0.1–0.9)
Monocytes: 8 %
Neutrophils Absolute: 4.6 10*3/uL (ref 1.4–7.0)
Neutrophils: 73 %
Platelets: 246 10*3/uL (ref 150–450)
RBC: 4.44 x10E6/uL (ref 3.77–5.28)
RDW: 12.4 % (ref 11.7–15.4)
WBC: 6.2 10*3/uL (ref 3.4–10.8)

## 2022-02-20 LAB — URINALYSIS, ROUTINE W REFLEX MICROSCOPIC
Bilirubin, UA: NEGATIVE
Glucose, UA: NEGATIVE
Ketones, UA: NEGATIVE
Nitrite, UA: NEGATIVE
Protein,UA: NEGATIVE
RBC, UA: NEGATIVE
Specific Gravity, UA: 1.005 (ref 1.005–1.030)
Urobilinogen, Ur: 0.2 mg/dL (ref 0.2–1.0)
pH, UA: 7.5 (ref 5.0–7.5)

## 2022-02-20 LAB — LIPID PANEL W/O CHOL/HDL RATIO
Cholesterol, Total: 233 mg/dL — ABNORMAL HIGH (ref 100–199)
HDL: 74 mg/dL (ref >39)
LDL Chol Calc (NIH): 139 mg/dL — ABNORMAL HIGH (ref 0–99)
Triglycerides: 115 mg/dL (ref 0–149)
VLDL Cholesterol Cal: 20 mg/dL (ref 5–40)

## 2022-02-20 LAB — MICROSCOPIC EXAMINATION
Bacteria, UA: NONE SEEN
Casts: NONE SEEN /lpf
RBC, Urine: NONE SEEN /hpf (ref 0–2)
WBC, UA: NONE SEEN /hpf (ref 0–5)

## 2022-02-20 LAB — LIPASE: Lipase: 24 U/L (ref 14–72)

## 2022-02-25 ENCOUNTER — Encounter: Payer: Self-pay | Admitting: Nurse Practitioner

## 2022-05-20 ENCOUNTER — Encounter: Payer: Self-pay | Admitting: Nurse Practitioner

## 2022-05-20 MED ORDER — LOSARTAN POTASSIUM-HCTZ 100-25 MG PO TABS: 1.0000 | ORAL_TABLET | Freq: Every day | ORAL | 4 refills | Status: DC

## 2022-05-20 MED ORDER — NEBIVOLOL HCL 2.5 MG PO TABS: ORAL_TABLET | ORAL | 4 refills | Status: DC

## 2022-07-02 ENCOUNTER — Ambulatory Visit (INDEPENDENT_AMBULATORY_CARE_PROVIDER_SITE_OTHER): Payer: Medicare Other | Admitting: *Deleted

## 2022-07-02 ENCOUNTER — Ambulatory Visit: Payer: Medicare Other | Admitting: Dermatology

## 2022-07-02 DIAGNOSIS — D229 Melanocytic nevi, unspecified: Secondary | ICD-10-CM | POA: Diagnosis not present

## 2022-07-02 DIAGNOSIS — L219 Seborrheic dermatitis, unspecified: Secondary | ICD-10-CM | POA: Diagnosis not present

## 2022-07-02 DIAGNOSIS — Z1283 Encounter for screening for malignant neoplasm of skin: Secondary | ICD-10-CM | POA: Diagnosis not present

## 2022-07-02 DIAGNOSIS — L578 Other skin changes due to chronic exposure to nonionizing radiation: Secondary | ICD-10-CM | POA: Diagnosis not present

## 2022-07-02 DIAGNOSIS — L821 Other seborrheic keratosis: Secondary | ICD-10-CM

## 2022-07-02 DIAGNOSIS — L820 Inflamed seborrheic keratosis: Secondary | ICD-10-CM

## 2022-07-02 DIAGNOSIS — Z0000 Encounter for general adult medical examination without abnormal findings: Secondary | ICD-10-CM

## 2022-07-02 DIAGNOSIS — D1800 Hemangioma unspecified site: Secondary | ICD-10-CM

## 2022-07-02 DIAGNOSIS — Z808 Family history of malignant neoplasm of other organs or systems: Secondary | ICD-10-CM

## 2022-07-02 DIAGNOSIS — L814 Other melanin hyperpigmentation: Secondary | ICD-10-CM

## 2022-07-02 MED ORDER — MOMETASONE FUROATE 0.1 % EX SOLN: Freq: Every day | CUTANEOUS | 5 refills | Status: DC

## 2022-07-02 NOTE — Progress Notes (Signed)
Subjective:   Pamela Reid is a 66 y.o. female who presents for an Initial Medicare Annual Wellness Visit.  I connected with  [name] on 07/02/22 by a telephone enabled telemedicine application and verified that I am speaking with the correct person using two identifiers.   I discussed the limitations of evaluation and management by telemedicine. The patient expressed understanding and agreed to proceed.  Patient location: home  Provider location: Tele-health-home    Review of Systems           Objective:    There were no vitals filed for this visit. There is no height or weight on file to calculate BMI.     09/11/2021    6:55 AM  Advanced Directives  Does Patient Have a Medical Advance Directive? Yes  Type of Paramedic of [place];Living will    Current Medications (verified) Outpatient Encounter Medications as of 07/02/2022  Medication Sig   Aspirin 81 MG CAPS Take by mouth.   Calcium Polycarbophil (FIBER) 625 MG TABS Take by mouth.   Cholecalciferol 75 MCG (3000 UT) TABS Take by mouth.   co-enzyme Q-10 30 MG capsule Take by mouth.   esomeprazole (NEXIUM) 20 MG capsule Take by mouth.   ibuprofen (ADVIL) 200 MG tablet Take by mouth.   losartan-hydrochlorothiazide (HYZAAR) 100-25 MG tablet Take 1 tablet by mouth daily.   Multiple Vitamin (MULTI-VITAMINS) TABS Take 1 tablet by mouth daily.   nebivolol (BYSTOLIC) 2.5 MG tablet TAKE 1 TABLET DAILY BY MOUTH FOR HYPERTENSION   No facility-administered encounter medications on file as of 07/02/2022.    Allergies (verified) Cephalexin and Penicillins   History: Past Medical History:  Diagnosis Date   Allergy    Cancer ([place])    Family history of breast cancer    Family history of melanoma    GERD (gastroesophageal reflux disease)    Hypertension    Lynch syndrome    Past Surgical History:  Procedure Laterality Date   ABDOMINAL HYSTERECTOMY     BREAST BIOPSY Left 2019    stereotatic bx neg   CHOLECYSTECTOMY     COLONOSCOPY WITH PROPOFOL N/A 09/11/2021   Procedure: COLONOSCOPY WITH PROPOFOL;  Surgeon: Lin Landsman, MD;  Location: [facility] ENDOSCOPY;  Service: Gastroenterology;  Laterality: N/A;   Family History  Problem Relation Age of Onset   Hypertension Mother    Melanoma Mother    Parkinson's disease Father    Dementia Father    Healthy Sister    Healthy Brother    Breast cancer Maternal Aunt        dx 56s   Melanoma Paternal Aunt    Cancer Maternal Grandmother        "GI cancer" dx 35s   Cancer Maternal Grandfather        "GI cancer" dx 47s   Breast cancer Paternal Grandmother        dx 14s   Parkinson's disease Paternal Grandfather    Healthy Daughter    Healthy Son    Social History   Socioeconomic History   Marital status: Married    Spouse name: Not on file   Number of children: Not on file   Years of education: Not on file   Highest education level: Not on file  Occupational History   Not on file  Tobacco Use   Smoking status: Never   Smokeless tobacco: Never  Vaping Use   Vaping Use: Never used  Substance and Sexual Activity  Alcohol use: Yes    Alcohol/week: 1.0 standard drink of alcohol    Types: 1 Glasses of wine per week    Comment: occasional   Drug use: Never   Sexual activity: Yes  Other Topics Concern   Not on file  Social History Narrative   Not on file   Social Determinants of Health   Financial Resource Strain: Low Risk  (05/03/2021)   Overall Financial Resource Strain (CARDIA)    Difficulty of Paying Living Expenses: Not hard at all  Food Insecurity: No Food Insecurity (05/03/2021)   Hunger Vital Sign    Worried About Running Out of Food in the Last Year: Never true    [place] in the Last Year: Never true  Transportation Needs: No Transportation Needs (05/03/2021)   PRAPARE - Hydrologist (Medical): No    Lack of Transportation (Non-Medical): No  Physical  Activity: Sufficiently Active (05/03/2021)   Exercise Vital Sign    Days of Exercise per Week: 5 days    Minutes of Exercise per Session: 60 min  Stress: No Stress Concern Present (05/03/2021)   [place]    Feeling of Stress : Only a little  Social Connections: Moderately Isolated (05/03/2021)   Social Connection and Isolation Panel [NHANES]    Frequency of Communication with Friends and Family: More than three times a week    Frequency of Social Gatherings with Friends and Family: More than three times a week    Attends Religious Services: Never    Marine scientist or Organizations: No    Attends Archivist Meetings: Never    Marital Status: Married    Tobacco Counseling Counseling given: Not Answered   Clinical Intake:                 Diabetic?  no         Activities of Daily Living    07/22/2021    8:20 AM  In your present state of health, do you have any difficulty performing the following activities:  Hearing? 0  Vision? 0  Difficulty concentrating or making decisions? 0  Walking or climbing stairs? 0  Dressing or bathing? 0  Doing errands, shopping? 0    Patient Care Team: Venita Lick, NP as PCP - General (Nurse Practitioner)  Indicate any recent Medical Services you may have received from other than [facility] providers in the past year (date may be approximate).     Assessment:   This is a routine wellness examination for Pamela Reid.  Hearing/Vision screen No results found.  Dietary issues and exercise activities discussed:     Goals Addressed   None    Depression Screen    01/22/2022    8:29 AM 07/22/2021    8:10 AM 05/03/2021    8:40 AM  PHQ 2/9 Scores  PHQ - 2 Score 0 0 0  PHQ- 9 Score 1      Fall Risk    01/22/2022    8:29 AM 05/03/2021    8:40 AM  Fall Risk   Falls in the past year? 0 0  Number falls in past yr: 0 0  Injury with Fall? 0 0  Risk for  fall due to : No Fall Risks No Fall Risks  Follow up Falls evaluation completed Falls evaluation completed    [place]:  Any stairs in or around  the home? Yes  If so, are there any without handrails? No  Home free of loose throw rugs in walkways, pet beds, electrical cords, etc? Yes  Adequate lighting in your home to reduce risk of falls? Yes   ASSISTIVE DEVICES UTILIZED TO PREVENT FALLS:  Life alert? No  Use of a cane, walker or w/c? No  Grab bars in the bathroom? Yes  Shower chair or bench in shower? Yes  Elevated toilet seat or a handicapped toilet? No   TIMED UP AND GO:  Was the test performed? No .    Cognitive Function:        Immunizations Immunization History  Administered Date(s) Administered   Influenza Split 08/25/2018, 09/08/2019   Influenza, High Dose Seasonal PF 08/15/2021   Influenza,inj,Quad PF,6+ Mos 08/30/2017   Influenza,inj,quad, With Preservative 10/14/2010   PFIZER(Purple Top)SARS-COV-2 Vaccination 12/12/2019, 01/02/2020, 08/27/2020, 03/11/2021   PPD Test 10/13/2019, 09/14/2020   Pfizer Covid-19 Vaccine Bivalent Booster 72yr & up 08/15/2021   Pneumococcal Conjugate-13 07/22/2021   Tdap 12/31/2011, 09/14/2020   Zoster Recombinat (Shingrix) 12/23/2020, 03/11/2021   Zoster, Live 02/06/2014    TDAP status: Up to date  Flu Vaccine status: Up to date  Pneumococcal vaccine status: Due, Education has been provided regarding the importance of this vaccine. Advised may receive this vaccine at local pharmacy or Health Dept. Aware to provide a copy of the vaccination record if obtained from local pharmacy or Health Dept. Verbalized acceptance and understanding.  Covid-19 vaccine status: Information provided on how to obtain vaccines.   Qualifies for Shingles Vaccine? No   Zostavax completed Yes   Shingrix Completed?: Yes  Screening Tests Health Maintenance  Topic Date Due   COVID-19 Vaccine (6 - Pfizer series)  12/15/2021   INFLUENZA VACCINE  07/01/2022   Pneumonia Vaccine 67 Years old (2 - PPSV23 or PCV20) 07/22/2022   COLONOSCOPY (Pts 45-423yrInsurance coverage will need to be confirmed)  09/12/2023   MAMMOGRAM  12/31/2023   TETANUS/TDAP  09/14/2030   DEXA SCAN  12/31/2031   Hepatitis C Screening  Completed   Zoster Vaccines- Shingrix  Completed   HPV VACCINES  Aged Out    Health Maintenance  Health Maintenance Due  Topic Date Due   COVID-19 Vaccine (6 - Pfizer series) 12/15/2021   INFLUENZA VACCINE  07/01/2022   Pneumonia Vaccine 6557Years old (2 - PPSV23 or PCV20) 07/22/2022    Colorectal cancer screening: Type of screening: Colonoscopy. Completed 2022. Repeat every 3-5 years  Mammogram status: Completed  . Repeat every year  Bone Density status: Completed 2023. Results reflect: Bone density results: NORMAL. Repeat every 10 years.  Lung Cancer Screening: (Low Dose CT Chest recommended if Age 67-80ears, 30 pack-year currently smoking OR have quit w/in 15years.) does not qualify.   Lung Cancer Screening Referral:   Additional Screening:  Hepatitis C Screening: does not qualify;   Vision Screening: Recommended annual ophthalmology exams for early detection of glaucoma and other disorders of the eye. Is the patient up to date with their annual eye exam?  Yes  Who is the provider or what is the name of the office in which the patient attends annual eye exams? RiKerin Ransomf pt is not established with a provider, would they like to be referred to a provider to establish care? No .   Dental Screening: Recommended annual dental exams for proper oral hygiene  Community Resource Referral / Chronic Care Management: CRR required this visit?  No   CCM required this  visit?  No      Plan:     I have personally reviewed and noted the following in the patient's chart:   Medical and social history Use of alcohol, tobacco or illicit drugs  Current medications and supplements  including opioid prescriptions. Patient is not currently taking opioid prescriptions. Functional ability and status Nutritional status Physical activity Advanced directives List of other physicians Hospitalizations, surgeries, and ER visits in previous 12 months Vitals Screenings to include cognitive, depression, and falls Referrals and appointments  In addition, I have reviewed and discussed with patient certain preventive protocols, quality metrics, and best practice recommendations. A written personalized care plan for preventive services as well as general preventive health recommendations were provided to patient.     Leroy Kennedy, LPN   2/0/2542   Nurse Notes:

## 2022-07-02 NOTE — Progress Notes (Signed)
New Pt Visit   Subjective  Pamela Reid is a 66 y.o. female who presents for the following: Other (New patient - Family history of Melanoma - The patient presents for Total-Body Skin Exam (TBSE) for skin cancer screening and mole check.  The patient has spots, moles and lesions to be evaluated, some may be new or changing and the patient has concerns that these could be cancer./).  The following portions of the chart were reviewed this encounter and updated as appropriate:   Tobacco  Allergies  Meds  Problems  Med Hx  Surg Hx  Fam Hx     Review of Systems:  No other skin or systemic complaints except as noted in HPI or Assessment and Plan.  Objective  Well appearing patient in no apparent distress; mood and affect are within normal limits.  A full examination was performed including scalp, head, eyes, ears, nose, lips, neck, chest, axillae, abdomen, back, buttocks, bilateral upper extremities, bilateral lower extremities, hands, feet, fingers, toes, fingernails, and toenails. All findings within normal limits unless otherwise noted below.  Scalp ears, postauricular areas Pinkness and scale  Right scapula x 1, right medial scapula x 1 (2) Erythematous stuck-on, waxy papule or plaque   Assessment & Plan   Family history of skin cancer - what type(s): Melanoma - who affected: Mother  Lentigines - Scattered tan macules - Due to sun exposure - Benign-appearing, observe - Recommend daily broad spectrum sunscreen SPF 30+ to sun-exposed areas, reapply every 2 hours as needed. - Call for any changes  Seborrheic Keratoses - Stuck-on, waxy, tan-brown papules and/or plaques  - Benign-appearing - Discussed benign etiology and prognosis. - Observe - Call for any changes  Melanocytic Nevi - Tan-brown and/or pink-flesh-colored symmetric macules and papules - Benign appearing on exam today - Observation - Call clinic for new or changing moles - Recommend daily use of broad  spectrum spf 30+ sunscreen to sun-exposed areas.   Hemangiomas - Red papules - Discussed benign nature - Observe - Call for any changes  Actinic Damage - Chronic condition, secondary to cumulative UV/sun exposure - diffuse scaly erythematous macules with underlying dyspigmentation - Recommend daily broad spectrum sunscreen SPF 30+ to sun-exposed areas, reapply every 2 hours as needed.  - Staying in the shade or wearing long sleeves, sun glasses (UVA+UVB protection) and wide brim hats (4-inch brim around the entire circumference of the hat) are also recommended for sun protection.  - Call for new or changing lesions.  Skin cancer screening performed today.  Seborrheic dermatitis Scalp ears, postauricular areas Seborrheic Dermatitis/Psoriasis vs Eczema Start Mometasone lotion qd up to 5 days per week as needed  Psoriasis is a chronic non-curable, but treatable genetic/hereditary disease that may have other systemic features affecting other organ systems such as joints (Psoriatic Arthritis). It is associated with an increased risk of inflammatory bowel disease, heart disease, non-alcoholic fatty liver disease, and depression.    mometasone (ELOCON) 0.1 % lotion - Scalp ears, postauricular areas Apply topically daily. PRN up to 5 days per week  Inflamed seborrheic keratosis (2) Right scapula x 1, right medial scapula x 1 Recheck on follow up Destruction of lesion - Right scapula x 1, right medial scapula x 1 Complexity: simple   Destruction method: cryotherapy   Informed consent: discussed and consent obtained   Timeout:  patient name, date of birth, surgical site, and procedure verified Lesion destroyed using liquid nitrogen: Yes   Region frozen until ice ball extended beyond lesion:  Yes   Outcome: patient tolerated procedure well with no complications   Post-procedure details: wound care instructions given    Return in about 3 months (around 10/02/2022) for ISK follow up,  Psoriasis.  I, Ashok Cordia, CMA, am acting as scribe for Sarina Ser, MD . Documentation: I have reviewed the above documentation for accuracy and completeness, and I agree with the above.  Sarina Ser, MD

## 2022-07-02 NOTE — Patient Instructions (Signed)
Cryotherapy Aftercare  Wash gently with soap and water everyday.   Apply Vaseline and Band-Aid daily until healed.     Due to recent changes in healthcare laws, you may see results of your pathology and/or laboratory studies on MyChart before the doctors have had a chance to review them. We understand that in some cases there may be results that are confusing or concerning to you. Please understand that not all results are received at the same time and often the doctors may need to interpret multiple results in order to provide you with the best plan of care or course of treatment. Therefore, we ask that you please give us 2 business days to thoroughly review all your results before contacting the office for clarification. Should we see a critical lab result, you will be contacted sooner.   If You Need Anything After Your Visit  If you have any questions or concerns for your doctor, please call our main line at [phone] and press option 4 to reach your doctor's medical assistant. If no one answers, please leave a voicemail as directed and we will return your call as soon as possible. Messages left after 4 pm will be answered the following business day.   You may also send us a message via MyChart. We typically respond to MyChart messages within 1-2 business days.  For prescription refills, please ask your pharmacy to contact our office. Our fax number is [phone].  If you have an urgent issue when the clinic is closed that cannot wait until the next business day, you can page your doctor at the number below.    Please note that while we do our best to be available for urgent issues outside of office hours, we are not available 24/7.   If you have an urgent issue and are unable to reach us, you may choose to seek medical care at your doctor's office, retail clinic, urgent care center, or emergency room.  If you have a medical emergency, please immediately call 911 or go to the  emergency department.  Pager Numbers  - Dr. Kowalski: [phone]  - Dr. Moye: [phone]  - Dr. Stewart: [phone]  In the event of inclement weather, please call our main line at [phone] for an update on the status of any delays or closures.  Dermatology Medication Tips: Please keep the boxes that topical medications come in in order to help keep track of the instructions about where and how to use these. Pharmacies typically print the medication instructions only on the boxes and not directly on the medication tubes.   If your medication is too expensive, please contact our office at [phone] option 4 or send us a message through MyChart.   We are unable to tell what your co-pay for medications will be in advance as this is different depending on your insurance coverage. However, we may be able to find a substitute medication at lower cost or fill out paperwork to get insurance to cover a needed medication.   If a prior authorization is required to get your medication covered by your insurance company, please allow us 1-2 business days to complete this process.  Drug prices often vary depending on where the prescription is filled and some pharmacies may offer cheaper prices.  The website www.goodrx.com contains coupons for medications through different pharmacies. The prices here do not account for what the cost may be with help from insurance (it may be cheaper with your insurance), but the website can   give you the price if you did not use any insurance.  - You can print the associated coupon and take it with your prescription to the pharmacy.  - You may also stop by our office during regular business hours and pick up a GoodRx coupon card.  - If you need your prescription sent electronically to a different pharmacy, notify our office through [facility] MyChart or by phone at [phone] option 4.     Si Usted Necesita Algo Despus de Su Visita  Tambin puede  enviarnos un mensaje a travs de MyChart. Por lo general respondemos a los mensajes de MyChart en el transcurso de 1 a 2 das hbiles.  Para renovar recetas, por favor pida a su farmacia que se ponga en contacto con nuestra oficina. Nuestro nmero de fax es el [phone].  Si tiene un asunto urgente cuando la clnica est cerrada y que no puede esperar hasta el siguiente da hbil, puede llamar/localizar a su doctor(a) al nmero que aparece a continuacin.   Por favor, tenga en cuenta que aunque hacemos todo lo posible para estar disponibles para asuntos urgentes fuera del horario de oficina, no estamos disponibles las 24 horas del da, los 7 das de la semana.   Si tiene un problema urgente y no puede comunicarse con nosotros, puede optar por buscar atencin mdica  en el consultorio de su doctor(a), en una clnica privada, en un centro de atencin urgente o en una sala de emergencias.  Si tiene una emergencia mdica, por favor llame inmediatamente al 911 o vaya a la sala de emergencias.  Nmeros de bper  - Dr. Kowalski: [phone]  - Dra. Moye: [phone]  - Dra. Stewart: [phone]  En caso de inclemencias del tiempo, por favor llame a nuestra lnea principal al [phone] para una actualizacin sobre el estado de cualquier retraso o cierre.  Consejos para la medicacin en dermatologa: Por favor, guarde las cajas en las que vienen los medicamentos de uso tpico para ayudarle a seguir las instrucciones sobre dnde y cmo usarlos. Las farmacias generalmente imprimen las instrucciones del medicamento slo en las cajas y no directamente en los tubos del medicamento.   Si su medicamento es muy caro, por favor, pngase en contacto con nuestra oficina llamando al [phone] y presione la opcin 4 o envenos un mensaje a travs de MyChart.   No podemos decirle cul ser su copago por los medicamentos por adelantado ya que esto es diferente dependiendo de la cobertura de su seguro.  Sin embargo, es posible que podamos encontrar un medicamento sustituto a menor costo o llenar un formulario para que el seguro cubra el medicamento que se considera necesario.   Si se requiere una autorizacin previa para que su compaa de seguros cubra su medicamento, por favor permtanos de 1 a 2 das hbiles para completar este proceso.  Los precios de los medicamentos varan con frecuencia dependiendo del lugar de dnde se surte la receta y alguna farmacias pueden ofrecer precios ms baratos.  El sitio web www.goodrx.com tiene cupones para medicamentos de diferentes farmacias. Los precios aqu no tienen en cuenta lo que podra costar con la ayuda del seguro (puede ser ms barato con su seguro), pero el sitio web puede darle el precio si no utiliz ningn seguro.  - Puede imprimir el cupn correspondiente y llevarlo con su receta a la farmacia.  - Tambin puede pasar por nuestra oficina durante el horario de atencin regular y recoger una tarjeta de cupones de GoodRx.  -   Si necesita que su receta se enve electrnicamente a una farmacia diferente, informe a nuestra oficina a travs de MyChart de [place] o por telfono llamando al [phone] y presione la opcin 4.  

## 2022-07-02 NOTE — Patient Instructions (Signed)
Pamela Reid , Thank you for taking time to come for your Medicare Wellness Visit. I appreciate your ongoing commitment to your health goals. Please review the following plan we discussed and let me know if I can assist you in the future.   Screening recommendations/referrals: Colonoscopy: up to date Mammogram: up to date Bone Density: up to date Recommended yearly ophthalmology/optometry visit for glaucoma screening and checkup Recommended yearly dental visit for hygiene and checkup  Vaccinations: Influenza vaccine: up to date Pneumococcal vaccine: up to date Tdap vaccine: up to date Shingles vaccine: up to date    Advanced directives: not on file  Conditions/risks identified:   Next appointment: 07-23-2022 8:00  [place] 65 Years and Older, Female Preventive care refers to lifestyle choices and visits with your health care provider that can promote health and wellness. What does preventive care include? A yearly physical exam. This is also called an annual well check. Dental exams once or twice a year. Routine eye exams. Ask your health care provider how often you should have your eyes checked. Personal lifestyle choices, including: Daily care of your teeth and gums. Regular physical activity. Eating a healthy diet. Avoiding tobacco and drug use. Limiting alcohol use. Practicing safe sex. Taking low-dose aspirin every day. Taking vitamin and mineral supplements as recommended by your health care provider. What happens during an annual well check? The services and screenings done by your health care provider during your annual well check will depend on your age, overall health, lifestyle risk factors, and family history of disease. Counseling  Your health care provider may ask you questions about your: Alcohol use. Tobacco use. Drug use. Emotional well-being. Home and relationship well-being. Sexual activity. Eating habits. History of falls. Memory and  ability to understand (cognition). Work and work Statistician. Reproductive health. Screening  You may have the following tests or measurements: Height, weight, and BMI. Blood pressure. Lipid and cholesterol levels. These may be checked every 5 years, or more frequently if you are over 66 years old. Skin check. Lung cancer screening. You may have this screening every year starting at age 5 if you have a 30-pack-year history of smoking and currently smoke or have quit within the past 15 years. Fecal occult blood test (FOBT) of the stool. You may have this test every year starting at age 74. Flexible sigmoidoscopy or colonoscopy. You may have a sigmoidoscopy every 5 years or a colonoscopy every 10 years starting at age 42. Hepatitis C blood test. Hepatitis B blood test. Sexually transmitted disease (STD) testing. Diabetes screening. This is done by checking your blood sugar (glucose) after you have not eaten for a while (fasting). You may have this done every 1-3 years. Bone density scan. This is done to screen for osteoporosis. You may have this done starting at age 40. Mammogram. This may be done every 1-2 years. Talk to your health care provider about how often you should have regular mammograms. Talk with your health care provider about your test results, treatment options, and if necessary, the need for more tests. Vaccines  Your health care provider may recommend certain vaccines, such as: Influenza vaccine. This is recommended every year. Tetanus, diphtheria, and acellular pertussis (Tdap, Td) vaccine. You may need a Td booster every 10 years. Zoster vaccine. You may need this after age 67. Pneumococcal 13-valent conjugate (PCV13) vaccine. One dose is recommended after age 45. Pneumococcal polysaccharide (PPSV23) vaccine. One dose is recommended after age 24. Talk to your health  care provider about which screenings and vaccines you need and how often you need them. This information is  not intended to replace advice given to you by your health care provider. Make sure you discuss any questions you have with your health care provider. Document Released: 12/14/2015 Document Revised: 08/06/2016 Document Reviewed: 09/18/2015 Elsevier Interactive Patient Education  2017 [place] Prevention in the Home Falls can cause injuries. They can happen to people of all ages. There are many things you can do to make your home safe and to help prevent falls. What can I do on the outside of my home? Regularly fix the edges of walkways and driveways and fix any cracks. Remove anything that might make you trip as you walk through a door, such as a raised step or threshold. Trim any bushes or trees on the path to your home. Use bright outdoor lighting. Clear any walking paths of anything that might make someone trip, such as rocks or tools. Regularly check to see if handrails are loose or broken. Make sure that both sides of any steps have handrails. Any raised decks and porches should have guardrails on the edges. Have any leaves, snow, or ice cleared regularly. Use sand or salt on walking paths during winter. Clean up any spills in your garage right away. This includes oil or grease spills. What can I do in the bathroom? Use night lights. Install grab bars by the toilet and in the tub and shower. Do not use towel bars as grab bars. Use non-skid mats or decals in the tub or shower. If you need to sit down in the shower, use a plastic, non-slip stool. Keep the floor dry. Clean up any water that spills on the floor as soon as it happens. Remove soap buildup in the tub or shower regularly. Attach bath mats securely with double-sided non-slip rug tape. Do not have throw rugs and other things on the floor that can make you trip. What can I do in the bedroom? Use night lights. Make sure that you have a light by your bed that is easy to reach. Do not use any sheets or blankets that  are too big for your bed. They should not hang down onto the floor. Have a firm chair that has side arms. You can use this for support while you get dressed. Do not have throw rugs and other things on the floor that can make you trip. What can I do in the kitchen? Clean up any spills right away. Avoid walking on wet floors. Keep items that you use a lot in easy-to-reach places. If you need to reach something above you, use a strong step stool that has a grab bar. Keep electrical cords out of the way. Do not use floor polish or wax that makes floors slippery. If you must use wax, use non-skid floor wax. Do not have throw rugs and other things on the floor that can make you trip. What can I do with my stairs? Do not leave any items on the stairs. Make sure that there are handrails on both sides of the stairs and use them. Fix handrails that are broken or loose. Make sure that handrails are as long as the stairways. Check any carpeting to make sure that it is firmly attached to the stairs. Fix any carpet that is loose or worn. Avoid having throw rugs at the top or bottom of the stairs. If you do have throw rugs, attach them to  the floor with carpet tape. Make sure that you have a light switch at the top of the stairs and the bottom of the stairs. If you do not have them, ask someone to add them for you. What else can I do to help prevent falls? Wear shoes that: Do not have high heels. Have rubber bottoms. Are comfortable and fit you well. Are closed at the toe. Do not wear sandals. If you use a stepladder: Make sure that it is fully opened. Do not climb a closed stepladder. Make sure that both sides of the stepladder are locked into place. Ask someone to hold it for you, if possible. Clearly mark and make sure that you can see: Any grab bars or handrails. First and last steps. Where the edge of each step is. Use tools that help you move around (mobility aids) if they are needed. These  include: Canes. Walkers. Scooters. Crutches. Turn on the lights when you go into a dark area. Replace any light bulbs as soon as they burn out. Set up your furniture so you have a clear path. Avoid moving your furniture around. If any of your floors are uneven, fix them. If there are any pets around you, be aware of where they are. Review your medicines with your doctor. Some medicines can make you feel dizzy. This can increase your chance of falling. Ask your doctor what other things that you can do to help prevent falls. This information is not intended to replace advice given to you by your health care provider. Make sure you discuss any questions you have with your health care provider. Document Released: 09/13/2009 Document Revised: 04/24/2016 Document Reviewed: 12/22/2014 Elsevier Interactive Patient Education  2017 [employer].

## 2022-07-03 ENCOUNTER — Encounter: Payer: Self-pay | Admitting: Dermatology

## 2022-07-20 NOTE — Patient Instructions (Signed)

## 2022-07-23 ENCOUNTER — Encounter: Payer: Self-pay | Admitting: Nurse Practitioner

## 2022-07-23 ENCOUNTER — Ambulatory Visit (INDEPENDENT_AMBULATORY_CARE_PROVIDER_SITE_OTHER): Payer: Medicare Other | Admitting: Nurse Practitioner

## 2022-07-23 VITALS — BP 137/84 | HR 70 | Temp 98.1°F | Ht 63.0 in | Wt 261.8 lb

## 2022-07-23 DIAGNOSIS — Z6841 Body Mass Index (BMI) 40.0 and over, adult: Secondary | ICD-10-CM

## 2022-07-23 DIAGNOSIS — I10 Essential (primary) hypertension: Secondary | ICD-10-CM | POA: Diagnosis not present

## 2022-07-23 DIAGNOSIS — Z0000 Encounter for general adult medical examination without abnormal findings: Secondary | ICD-10-CM

## 2022-07-23 DIAGNOSIS — Z23 Encounter for immunization: Secondary | ICD-10-CM | POA: Diagnosis not present

## 2022-07-23 DIAGNOSIS — E7800 Pure hypercholesterolemia, unspecified: Secondary | ICD-10-CM

## 2022-07-23 DIAGNOSIS — Z86010 Personal history of colonic polyps: Secondary | ICD-10-CM

## 2022-07-23 DIAGNOSIS — K219 Gastro-esophageal reflux disease without esophagitis: Secondary | ICD-10-CM

## 2022-07-23 DIAGNOSIS — Z1509 Genetic susceptibility to other malignant neoplasm: Secondary | ICD-10-CM

## 2022-07-23 DIAGNOSIS — Z803 Family history of malignant neoplasm of breast: Secondary | ICD-10-CM | POA: Diagnosis not present

## 2022-07-23 DIAGNOSIS — K449 Diaphragmatic hernia without obstruction or gangrene: Secondary | ICD-10-CM | POA: Diagnosis not present

## 2022-07-23 DIAGNOSIS — Z8542 Personal history of malignant neoplasm of other parts of uterus: Secondary | ICD-10-CM

## 2022-07-23 LAB — URINALYSIS, ROUTINE W REFLEX MICROSCOPIC
Bilirubin, UA: NEGATIVE
Glucose, UA: NEGATIVE
Ketones, UA: NEGATIVE
Leukocytes,UA: NEGATIVE
Nitrite, UA: NEGATIVE
Protein,UA: NEGATIVE
Specific Gravity, UA: 1.015 (ref 1.005–1.030)
Urobilinogen, Ur: 0.2 mg/dL (ref 0.2–1.0)
pH, UA: 7 (ref 5.0–7.5)

## 2022-07-23 LAB — MICROSCOPIC EXAMINATION: Bacteria, UA: NONE SEEN

## 2022-07-23 NOTE — Assessment & Plan Note (Signed)
Mammogram up to date and normal.

## 2022-07-23 NOTE — Assessment & Plan Note (Signed)
Genetic testing on 10/16/21 performed.  Continue to follow with GI for scheduled EGD and colonoscopies.  Amylase and Lipase today + CMP.

## 2022-07-23 NOTE — Assessment & Plan Note (Addendum)
Ongoing with use of Nexium.  Continue to monitor and adjust regimen as needed.  Mag level today.

## 2022-07-23 NOTE — Assessment & Plan Note (Signed)
Noted on past labs.  Continue diet and exercise focus.  Recheck labs today.  ASCVD >10% at this time, if ongoing elevation will recommend statin use.

## 2022-07-23 NOTE — Assessment & Plan Note (Signed)
BMI 46.38 -- some gain present with recent traveling.  Recommended eating smaller high protein, low fat meals more frequently and exercising 30 mins a day 5 times a week with a goal of 10-15lb weight loss in the next 3 months. Patient voiced their understanding and motivation to adhere to these recommendations.

## 2022-07-23 NOTE — Assessment & Plan Note (Signed)
Continues on Nexium, refer to GERD plan of care.

## 2022-07-23 NOTE — Assessment & Plan Note (Signed)
Chronic, stable with BP at goal today in office.  Recommend she monitor BP at least a few mornings a week at home and document.  DASH diet at home.  Continue current medication regimen and adjust as needed.  Refills up to date.  Labs: CMP, CBC, TSH, urine check. Return in 6 months.

## 2022-07-23 NOTE — Progress Notes (Signed)
BP 137/84   Pulse 70   Temp 98.1 F (36.7 C) (Oral)   Ht _0  (1.6 m)   Wt 261 lb 12.8 oz (118.8 kg)   SpO2 95%   BMI 46.38 kg/m    Subjective:    Patient ID: Pamela Reid, female    DOB: Jun 24, 1956, 66 y.o.   MRN: [id number]  HPI: Pamela Reid is a 66 y.o. female presenting on 07/23/2022 for comprehensive medical examination. Current medical complaints include:none  She currently lives with: husband Menopausal Symptoms: no  The 10-year ASCVD risk score (Arnett DK, et al., 2019) is: 10.2%   Values used to calculate the score:     Age: 38 years     Sex: Female     Is Non-Hispanic African American: No     Diabetic: No     Tobacco smoker: No     Systolic Blood Pressure: 992 mmHg     Is BP treated: Yes     HDL Cholesterol: 74 mg/dL     Total Cholesterol: 233 mg/dL  Depression Screen done today and results listed below:     07/23/2022    8:11 AM 07/02/2022    8:37 AM 01/22/2022    8:29 AM 07/22/2021    8:10 AM 05/03/2021    8:40 AM  Depression screen PHQ 2/9  Decreased Interest 0 0 0 0 0  Down, Depressed, Hopeless 0 0 0 0 0  PHQ - 2 Score 0 0 0 0 0  Altered sleeping 0  0    Tired, decreased energy 1  0    Change in appetite 1  1    Feeling bad or failure about yourself  0  0    Trouble concentrating 1  0    Moving slowly or fidgety/restless 0  0    Suicidal thoughts 0  0    PHQ-9 Score 3  1      The patient does not have a history of falls. I did not complete a risk assessment for falls. A plan of care for falls was not documented.  Functional Status Survey:     Past Medical History:  Past Medical History:  Diagnosis Date   Allergy    Cancer ([place])    Family history of breast cancer    Family history of melanoma    GERD (gastroesophageal reflux disease)    Hypertension    Lynch syndrome     Surgical History:  Past Surgical History:  Procedure Laterality Date   ABDOMINAL HYSTERECTOMY     BREAST BIOPSY Left 2019   stereotatic bx neg   CHOLECYSTECTOMY      COLONOSCOPY WITH PROPOFOL N/A 09/11/2021   Procedure: COLONOSCOPY WITH PROPOFOL;  Surgeon: Lin Landsman, MD;  Location: [facility] ENDOSCOPY;  Service: Gastroenterology;  Laterality: N/A;    Medications:  Current Outpatient Medications on File Prior to Visit  Medication Sig   Aspirin 81 MG CAPS Take by mouth.   Calcium Polycarbophil (FIBER) 625 MG TABS Take by mouth.   Cholecalciferol 75 MCG (3000 UT) TABS Take by mouth.   co-enzyme Q-10 30 MG capsule Take by mouth.   esomeprazole (NEXIUM) 20 MG capsule Take by mouth.   ibuprofen (ADVIL) 200 MG tablet Take by mouth.   losartan-hydrochlorothiazide (HYZAAR) 100-25 MG tablet Take 1 tablet by mouth daily.   mometasone (ELOCON) 0.1 % lotion Apply topically daily. PRN up to 5 days per week   Multiple Vitamin (MULTI-VITAMINS) TABS Take 1 tablet  by mouth daily.   nebivolol (BYSTOLIC) 2.5 MG tablet TAKE 1 TABLET DAILY BY MOUTH FOR HYPERTENSION   No current facility-administered medications on file prior to visit.    Allergies:  Allergies  Allergen Reactions   Cephalexin Hives and Rash    dermatitis dermatitis    Penicillins Hives and Rash    dermatitis dermatitis     Social History:  Social History   Socioeconomic History   Marital status: Married    Spouse name: Not on file   Number of children: Not on file   Years of education: Not on file   Highest education level: Not on file  Occupational History   Not on file  Tobacco Use   Smoking status: Never   Smokeless tobacco: Never  Vaping Use   Vaping Use: Never used  Substance and Sexual Activity   Alcohol use: Yes    Alcohol/week: 1.0 standard drink of alcohol    Types: 1 Glasses of wine per week    Comment: occasional   Drug use: Never   Sexual activity: Yes  Other Topics Concern   Not on file  Social History Narrative   Not on file   Social Determinants of Health   Financial Resource Strain: Low Risk  (07/02/2022)   Overall Financial Resource Strain (CARDIA)     Difficulty of Paying Living Expenses: Not hard at all  Food Insecurity: No Food Insecurity (07/02/2022)   Hunger Vital Sign    Worried About Running Out of Food in the Last Year: Never true    Ran Out of Food in the Last Year: Never true  Transportation Needs: No Transportation Needs (07/02/2022)   PRAPARE - Hydrologist (Medical): No    Lack of Transportation (Non-Medical): No  Physical Activity: Sufficiently Active (07/02/2022)   Exercise Vital Sign    Days of Exercise per Week: 5 days    Minutes of Exercise per Session: 60 min  Stress: No Stress Concern Present (05/03/2021)   [place]    Feeling of Stress : Only a little  Social Connections: Moderately Integrated (07/02/2022)   Social Connection and Isolation Panel [NHANES]    Frequency of Communication with Friends and Family: More than three times a week    Frequency of Social Gatherings with Friends and Family: More than three times a week    Attends Religious Services: Never    Marine scientist or Organizations: Yes    Attends Music therapist: More than 4 times per year    Marital Status: Married  Human resources officer Violence: Not At Risk (07/02/2022)   Humiliation, Afraid, Rape, and Kick questionnaire    Fear of Current or Ex-Partner: No    Emotionally Abused: No    Physically Abused: No    Sexually Abused: No   Social History   Tobacco Use  Smoking Status Never  Smokeless Tobacco Never   Social History   Substance and Sexual Activity  Alcohol Use Yes   Alcohol/week: 1.0 standard drink of alcohol   Types: 1 Glasses of wine per week   Comment: occasional    Family History:  Family History  Problem Relation Age of Onset   Hypertension Mother    Melanoma Mother    Parkinson's disease Father    Dementia Father    Healthy Sister    Healthy Brother    Breast cancer Maternal Aunt  dx 18s   Melanoma  Paternal Aunt    Cancer Maternal Grandmother        "GI cancer" dx 89s   Cancer Maternal Grandfather        "GI cancer" dx 33s   Breast cancer Paternal Grandmother        dx 12s   Parkinson's disease Paternal Grandfather    Healthy Daughter    Healthy Son     Past medical history, surgical history, medications, allergies, family history and social history reviewed with patient today and changes made to appropriate areas of the chart.   ROS All other ROS negative except what is listed above and in the HPI.      Objective:    BP 137/84   Pulse 70   Temp 98.1 F (36.7 C) (Oral)   Ht _0  (1.6 m)   Wt 261 lb 12.8 oz (118.8 kg)   SpO2 95%   BMI 46.38 kg/m   Wt Readings from Last 3 Encounters:  07/23/22 261 lb 12.8 oz (118.8 kg)  01/22/22 254 lb 9.6 oz (115.5 kg)  09/11/21 244 lb (110.7 kg)    Physical Exam Vitals and nursing note reviewed.  Constitutional:      General: She is awake. She is not in acute distress.    Appearance: She is well-developed. She is not ill-appearing.  HENT:     Head: Normocephalic and atraumatic.     Right Ear: Hearing, tympanic membrane, ear canal and external ear normal. No drainage.     Left Ear: Hearing, tympanic membrane, ear canal and external ear normal. No drainage.     Nose: Nose normal.     Right Sinus: No maxillary sinus tenderness or frontal sinus tenderness.     Left Sinus: No maxillary sinus tenderness or frontal sinus tenderness.     Mouth/Throat:     Mouth: Mucous membranes are moist.     Pharynx: Oropharynx is clear. Uvula midline. No pharyngeal swelling, oropharyngeal exudate or posterior oropharyngeal erythema.  Eyes:     General: Lids are normal.        Right eye: No discharge.        Left eye: No discharge.     Extraocular Movements: Extraocular movements intact.     Conjunctiva/sclera: Conjunctivae normal.     Pupils: Pupils are equal, round, and reactive to light.     Visual Fields: Right eye visual fields normal and  left eye visual fields normal.  Neck:     Thyroid: No thyromegaly.     Vascular: No carotid bruit.     Trachea: Trachea normal.  Cardiovascular:     Rate and Rhythm: Normal rate and regular rhythm.     Heart sounds: Normal heart sounds. No murmur heard.    No gallop.  Pulmonary:     Effort: Pulmonary effort is normal. No accessory muscle usage or respiratory distress.     Breath sounds: Normal breath sounds.  Chest:     Comments: Deferred per patient request, does home self exams and denies any concerns. Abdominal:     General: Bowel sounds are normal.     Palpations: Abdomen is soft. There is no hepatomegaly or splenomegaly.     Tenderness: There is no abdominal tenderness.  Musculoskeletal:        General: Normal range of motion.     Cervical back: Normal range of motion and neck supple.     Right lower leg: No edema.     Left  lower leg: No edema.  Lymphadenopathy:     Head:     Right side of head: No submental, submandibular, tonsillar, preauricular or posterior auricular adenopathy.     Left side of head: No submental, submandibular, tonsillar, preauricular or posterior auricular adenopathy.     Cervical: No cervical adenopathy.  Skin:    General: Skin is warm and dry.     Capillary Refill: Capillary refill takes less than 2 seconds.     Findings: No rash.  Neurological:     Mental Status: She is alert and oriented to person, place, and time.     Gait: Gait is intact.     Deep Tendon Reflexes: Reflexes are normal and symmetric.     Reflex Scores:      Brachioradialis reflexes are 2+ on the right side and 2+ on the left side.      Patellar reflexes are 2+ on the right side and 2+ on the left side. Psychiatric:        Attention and Perception: Attention normal.        Mood and Affect: Mood normal.        Speech: Speech normal.        Behavior: Behavior normal. Behavior is cooperative.        Thought Content: Thought content normal.        Judgment: Judgment normal.     Results for orders placed or performed in visit on 02/19/22  Microscopic Examination  Result Value Ref Range   WBC, UA None seen 0 - 5 /hpf   RBC, Urine None seen 0 - 2 /hpf   Epithelial Cells (non renal) 0-10 0 - 10 /hpf   Casts None seen None seen /lpf   Bacteria, UA None seen None seen/Few  CBC with Differential/Platelet  Result Value Ref Range   WBC 6.2 3.4 - 10.8 x10E3/uL   RBC 4.44 3.77 - 5.28 x10E6/uL   Hemoglobin 13.5 11.1 - 15.9 g/dL   Hematocrit 39.2 34.0 - 46.6 %   MCV 88 79 - 97 fL   MCH 30.4 26.6 - 33.0 pg   MCHC 34.4 31.5 - 35.7 g/dL   RDW 12.4 11.7 - 15.4 %   Platelets 246 150 - 450 x10E3/uL   Neutrophils 73 Not Estab. %   Lymphs 16 Not Estab. %   Monocytes 8 Not Estab. %   Eos 2 Not Estab. %   Basos 1 Not Estab. %   Neutrophils Absolute 4.6 1.4 - 7.0 x10E3/uL   Lymphocytes Absolute 1.0 0.7 - 3.1 x10E3/uL   Monocytes Absolute 0.5 0.1 - 0.9 x10E3/uL   EOS (ABSOLUTE) 0.1 0.0 - 0.4 x10E3/uL   Basophils Absolute 0.0 0.0 - 0.2 x10E3/uL   Immature Granulocytes 0 Not Estab. %   Immature Grans (Abs) 0.0 0.0 - 0.1 x10E3/uL  Comprehensive metabolic panel  Result Value Ref Range   Glucose 114 (H) 70 - 99 mg/dL   BUN 10 8 - 27 mg/dL   Creatinine, Ser 0.62 0.57 - 1.00 mg/dL   eGFR 98 >59 mL/min/1.73   BUN/Creatinine Ratio 16 12 - 28   Sodium 141 134 - 144 mmol/L   Potassium 4.4 3.5 - 5.2 mmol/L   Chloride 101 96 - 106 mmol/L   CO2 26 20 - 29 mmol/L   Calcium 9.6 8.7 - 10.3 mg/dL   Total Protein 6.7 6.0 - 8.5 g/dL   Albumin 4.3 3.8 - 4.8 g/dL   Globulin, Total 2.4 1.5 - 4.5 g/dL  Albumin/Globulin Ratio 1.8 1.2 - 2.2   Bilirubin Total 0.6 0.0 - 1.2 mg/dL   Alkaline Phosphatase 96 44 - 121 IU/L   AST 15 0 - 40 IU/L   ALT 13 0 - 32 IU/L  Urinalysis, Routine w reflex microscopic  Result Value Ref Range   Specific Gravity, UA 1.005 1.005 - 1.030   pH, UA 7.5 5.0 - 7.5   Color, UA Yellow Yellow   Appearance Ur Clear Clear   Leukocytes,UA Trace (A) Negative    Protein,UA Negative Negative/Trace   Glucose, UA Negative Negative   Ketones, UA Negative Negative   RBC, UA Negative Negative   Bilirubin, UA Negative Negative   Urobilinogen, Ur 0.2 0.2 - 1.0 mg/dL   Nitrite, UA Negative Negative   Microscopic Examination See below:   Lipid Panel w/o Chol/HDL Ratio  Result Value Ref Range   Cholesterol, Total 233 (H) 100 - 199 mg/dL   Triglycerides 115 0 - 149 mg/dL   HDL 74 >39 mg/dL   VLDL Cholesterol Cal 20 5 - 40 mg/dL   LDL Chol Calc (NIH) 139 (H) 0 - 99 mg/dL  Amylase  Result Value Ref Range   Amylase 51 31 - 110 U/L  Lipase  Result Value Ref Range   Lipase 24 14 - 72 U/L      Assessment & Plan:   Problem List Items Addressed This Visit       Cardiovascular and Mediastinum   Hypertension, essential    Chronic, stable with BP at goal today in office.  Recommend she monitor BP at least a few mornings a week at home and document.  DASH diet at home.  Continue current medication regimen and adjust as needed.  Refills up to date.  Labs: CMP, CBC, TSH, urine check. Return in 6 months.      Relevant Orders   CBC with Differential/Platelet   Comprehensive metabolic panel   TSH     Respiratory   Hiatal hernia    Continues on Nexium, refer to GERD plan of care.      Relevant Orders   Magnesium     Digestive   GERD (gastroesophageal reflux disease)    Ongoing with use of Nexium.  Continue to monitor and adjust regimen as needed.  Mag level today.      Relevant Orders   Magnesium     Other   Elevated low density lipoprotein (LDL) cholesterol level    Noted on past labs.  Continue diet and exercise focus.  Recheck labs today.  ASCVD >10% at this time, if ongoing elevation will recommend statin use.      Relevant Orders   Comprehensive metabolic panel   Lipid Panel w/o Chol/HDL Ratio   Family history of breast cancer    Mammogram up to date and normal.      History of endometrial cancer    History of in 2012, complete  hysterectomy performed.  Continue to monitor as has lymphocele reported post cancer treatment.      Obesity - Primary    BMI 46.38 -- some gain present with recent traveling.  Recommended eating smaller high protein, low fat meals more frequently and exercising 30 mins a day 5 times a week with a goal of 10-15lb weight loss in the next 3 months. Patient voiced their understanding and motivation to adhere to these recommendations.       PMS2-related Lynch syndrome (HNPCC4)    Genetic testing on 10/16/21 performed.  Continue to  follow with GI for scheduled EGD and colonoscopies.  Amylase and Lipase today + CMP.      Relevant Orders   Comprehensive metabolic panel   Amylase   Lipase   Urinalysis, Routine w reflex microscopic   Other Visit Diagnoses     Pneumococcal vaccination given       PPSV23 today.   Relevant Orders   Pneumococcal polysaccharide vaccine 23-valent greater than or equal to 2yo subcutaneous/IM   Encounter for annual physical exam       Annual physical today with labs and health maintenance reviewed, discussed with patient.        Follow up plan: Return in about 6 months (around 01/23/2023) for HTN/HLD, GERD.   LABORATORY TESTING:  - Pap smear: not applicable  IMMUNIZATIONS:   - Tdap: Tetanus vaccination status reviewed: last tetanus booster within 10 years. - Influenza: Up to date - Pneumovax: Up To Date - Prevnar: Up To Date - HPV: Not applicable - Zostavax vaccine: Up to date  SCREENING: -Mammogram: Up To Date - January 2023 - Colonoscopy: Up To Date - Bone Density: Up To Date -- January 2023 -Hearing Test: Not applicable  -Spirometry: Not applicable   PATIENT COUNSELING:   Advised to take 1 mg of folate supplement per day if capable of pregnancy.   Sexuality: Discussed sexually transmitted diseases, partner selection, use of condoms, avoidance of unintended pregnancy  and contraceptive alternatives.   Advised to avoid cigarette smoking.  I  discussed with the patient that most people either abstain from alcohol or drink within safe limits (<=14/week and <=4 drinks/occasion for males, <=7/weeks and <= 3 drinks/occasion for females) and that the risk for alcohol disorders and other health effects rises proportionally with the number of drinks per week and how often a drinker exceeds daily limits.  Discussed cessation/primary prevention of drug use and availability of treatment for abuse.   Diet: Encouraged to adjust caloric intake to maintain  or achieve ideal body weight, to reduce intake of dietary saturated fat and total fat, to limit sodium intake by avoiding high sodium foods and not adding table salt, and to maintain adequate dietary potassium and calcium preferably from fresh fruits, vegetables, and low-fat dairy products.    Stressed the importance of regular exercise  Injury prevention: Discussed safety belts, safety helmets, smoke detector, smoking near bedding or upholstery.   Dental health: Discussed importance of regular tooth brushing, flossing, and dental visits.    NEXT PREVENTATIVE PHYSICAL DUE IN 1 YEAR. Return in about 6 months (around 01/23/2023) for HTN/HLD, GERD.

## 2022-07-23 NOTE — Assessment & Plan Note (Signed)
History of in 2012, complete hysterectomy performed.  Continue to monitor as has lymphocele reported post cancer treatment.

## 2022-07-24 LAB — CBC WITH DIFFERENTIAL/PLATELET
Basophils Absolute: 0 10*3/uL (ref 0.0–0.2)
Basos: 0 %
EOS (ABSOLUTE): 0.1 10*3/uL (ref 0.0–0.4)
Eos: 2 %
Hematocrit: 41.1 % (ref 34.0–46.6)
Hemoglobin: 13.4 g/dL (ref 11.1–15.9)
Immature Grans (Abs): 0 10*3/uL (ref 0.0–0.1)
Immature Granulocytes: 0 %
Lymphocytes Absolute: 1.1 10*3/uL (ref 0.7–3.1)
Lymphs: 16 %
MCH: 29.7 pg (ref 26.6–33.0)
MCHC: 32.6 g/dL (ref 31.5–35.7)
MCV: 91 fL (ref 79–97)
Monocytes Absolute: 0.5 10*3/uL (ref 0.1–0.9)
Monocytes: 8 %
Neutrophils Absolute: 4.9 10*3/uL (ref 1.4–7.0)
Neutrophils: 74 %
Platelets: 247 10*3/uL (ref 150–450)
RBC: 4.51 x10E6/uL (ref 3.77–5.28)
RDW: 13.2 % (ref 11.7–15.4)
WBC: 6.6 10*3/uL (ref 3.4–10.8)

## 2022-07-24 LAB — LIPID PANEL W/O CHOL/HDL RATIO
Cholesterol, Total: 212 mg/dL — ABNORMAL HIGH (ref 100–199)
HDL: 78 mg/dL (ref >39)
LDL Chol Calc (NIH): 112 mg/dL — ABNORMAL HIGH (ref 0–99)
Triglycerides: 129 mg/dL (ref 0–149)
VLDL Cholesterol Cal: 22 mg/dL (ref 5–40)

## 2022-07-24 LAB — AMYLASE: Amylase: 49 U/L (ref 31–110)

## 2022-07-24 LAB — COMPREHENSIVE METABOLIC PANEL
ALT: 17 IU/L (ref 0–32)
AST: 14 IU/L (ref 0–40)
Albumin/Globulin Ratio: 2 (ref 1.2–2.2)
Albumin: 4.1 g/dL (ref 3.9–4.9)
Alkaline Phosphatase: 93 IU/L (ref 44–121)
BUN/Creatinine Ratio: 20 (ref 12–28)
BUN: 10 mg/dL (ref 8–27)
Bilirubin Total: 0.4 mg/dL (ref 0.0–1.2)
CO2: 24 mmol/L (ref 20–29)
Calcium: 9.6 mg/dL (ref 8.7–10.3)
Chloride: 99 mmol/L (ref 96–106)
Creatinine, Ser: 0.49 mg/dL — ABNORMAL LOW (ref 0.57–1.00)
Globulin, Total: 2.1 g/dL (ref 1.5–4.5)
Glucose: 106 mg/dL — ABNORMAL HIGH (ref 70–99)
Potassium: 4.1 mmol/L (ref 3.5–5.2)
Sodium: 138 mmol/L (ref 134–144)
Total Protein: 6.2 g/dL (ref 6.0–8.5)
eGFR: 103 mL/min/{1.73_m2} (ref >59)

## 2022-07-24 LAB — LIPASE: Lipase: 23 U/L (ref 14–72)

## 2022-07-24 LAB — MAGNESIUM: Magnesium: 2 mg/dL (ref 1.6–2.3)

## 2022-07-24 LAB — TSH: TSH: 2.78 u[IU]/mL (ref 0.450–4.500)

## 2022-07-24 NOTE — Progress Notes (Signed)
Contacted via [facility], your labs have returned: - Kidney function, creatinine and eGFR, remains normal, as is liver function, AST and ALT.   - CBC shows no anemia or infection. - Thyroid (TSH), pancreas (lipase and amylase) + magnesium level are all normal:) - Your cholesterol is still high, but continued recommendations to make lifestyle changes. Your LDL is above normal, but has trended down a little. The LDL is the bad cholesterol. Over time and in combination with inflammation and other factors, this contributes to plaque which in turn may lead to stroke and/or heart attack down the road. Sometimes high LDL is primarily genetic, and people might be eating all the right foods but still have high numbers. Other times, there is room for improvement in one's diet and eating healthier can bring this number down and potentially reduce one's risk of heart attack and/or stroke.   To reduce your LDL, Remember - more fruits and vegetables, more fish, and limit red meat and dairy products. More soy, nuts, beans, barley, lentils, oats and plant sterol ester enriched margarine instead of butter. I also encourage eliminating sugar and processed food. Remember, shop on the outside of the grocery store and visit your [employer]. If you would like to talk with me about dietary changes for your cholesterol, please let me know. We should recheck your cholesterol in 6-12 months.  I will place ASCVD risk score below, often if this gets to 10% or greater I will recommend statin use.  Any questions? Keep being stellar!!  Thank you for allowing me to participate in your care.  I appreciate you. Kindest regards, Aalaya Yadao The 10-year ASCVD risk score (Arnett DK, et al., 2019) is: 9.6%   Values used to calculate the score:     Age: 66 years     Sex: Female     Is Non-Hispanic African American: No     Diabetic: No     Tobacco smoker: No     Systolic Blood Pressure: 881 mmHg     Is BP  treated: Yes     HDL Cholesterol: 78 mg/dL     Total Cholesterol: 212 mg/dL

## 2022-08-25 ENCOUNTER — Other Ambulatory Visit: Payer: Self-pay

## 2022-08-25 MED ORDER — NEBIVOLOL HCL 2.5 MG PO TABS
ORAL_TABLET | ORAL | 4 refills | Status: DC
Start: 2022-08-25 — End: 2023-07-30

## 2022-09-08 ENCOUNTER — Telehealth: Payer: Self-pay

## 2022-09-08 NOTE — Patient Outreach (Signed)
  Care Coordination   09/08/2022 Name: Pamela Reid MRN: [id number] DOB: May 30, 1956   Care Coordination Outreach Attempts:  An unsuccessful telephone outreach was attempted today to offer the patient information about available care coordination services as a benefit of their health plan.   Follow Up Plan:  Additional outreach attempts will be made to offer the patient care coordination information and services.   Encounter Outcome:  No Answer  Care Coordination Interventions Activated:  No   Care Coordination Interventions:  No, not indicated    Noreene Larsson RN, MSN, [facility]  Mobile: [phone]

## 2022-09-24 ENCOUNTER — Other Ambulatory Visit: Payer: Self-pay

## 2022-09-24 DIAGNOSIS — L219 Seborrheic dermatitis, unspecified: Secondary | ICD-10-CM

## 2022-09-24 MED ORDER — LOSARTAN POTASSIUM-HCTZ 100-25 MG PO TABS: 1.0000 | ORAL_TABLET | Freq: Every day | ORAL | 4 refills | Status: DC

## 2022-09-24 MED ORDER — MOMETASONE FUROATE 0.1 % EX SOLN: Freq: Every day | CUTANEOUS | 3 refills | Status: DC

## 2022-09-24 NOTE — Progress Notes (Signed)
Mometasone sent to [facility] per fax request. aw

## 2022-10-02 ENCOUNTER — Ambulatory Visit: Payer: Medicare Other | Admitting: Dermatology

## 2022-10-10 ENCOUNTER — Ambulatory Visit: Payer: Medicare Other | Admitting: Dermatology

## 2022-10-10 DIAGNOSIS — Z79899 Other long term (current) drug therapy: Secondary | ICD-10-CM

## 2022-10-10 DIAGNOSIS — L408 Other psoriasis: Secondary | ICD-10-CM

## 2022-10-10 DIAGNOSIS — L219 Seborrheic dermatitis, unspecified: Secondary | ICD-10-CM

## 2022-10-10 DIAGNOSIS — L821 Other seborrheic keratosis: Secondary | ICD-10-CM | POA: Diagnosis not present

## 2022-10-10 MED ORDER — MOMETASONE FUROATE 0.1 % EX SOLN: Freq: Every day | CUTANEOUS | 3 refills | Status: DC

## 2022-10-10 MED ORDER — KETOCONAZOLE 2 % EX SHAM: MEDICATED_SHAMPOO | CUTANEOUS | 5 refills | Status: DC

## 2022-10-10 NOTE — Patient Instructions (Signed)
Due to recent changes in healthcare laws, you may see results of your pathology and/or laboratory studies on MyChart before the doctors have had a chance to review them. We understand that in some cases there may be results that are confusing or concerning to you. Please understand that not all results are received at the same time and often the doctors may need to interpret multiple results in order to provide you with the best plan of care or course of treatment. Therefore, we ask that you please give us 2 business days to thoroughly review all your results before contacting the office for clarification. Should we see a critical lab result, you will be contacted sooner.   If You Need Anything After Your Visit  If you have any questions or concerns for your doctor, please call our main line at [phone] and press option 4 to reach your doctor's medical assistant. If no one answers, please leave a voicemail as directed and we will return your call as soon as possible. Messages left after 4 pm will be answered the following business day.   You may also send us a message via MyChart. We typically respond to MyChart messages within 1-2 business days.  For prescription refills, please ask your pharmacy to contact our office. Our fax number is [phone].  If you have an urgent issue when the clinic is closed that cannot wait until the next business day, you can page your doctor at the number below.    Please note that while we do our best to be available for urgent issues outside of office hours, we are not available 24/7.   If you have an urgent issue and are unable to reach us, you may choose to seek medical care at your doctor's office, retail clinic, urgent care center, or emergency room.  If you have a medical emergency, please immediately call 911 or go to the emergency department.  Pager Numbers  - Dr. Kowalski: [phone]  - Dr. Moye: [phone]  - Dr. Stewart:  [phone]  In the event of inclement weather, please call our main line at [phone] for an update on the status of any delays or closures.  Dermatology Medication Tips: Please keep the boxes that topical medications come in in order to help keep track of the instructions about where and how to use these. Pharmacies typically print the medication instructions only on the boxes and not directly on the medication tubes.   If your medication is too expensive, please contact our office at [phone] option 4 or send us a message through MyChart.   We are unable to tell what your co-pay for medications will be in advance as this is different depending on your insurance coverage. However, we may be able to find a substitute medication at lower cost or fill out paperwork to get insurance to cover a needed medication.   If a prior authorization is required to get your medication covered by your insurance company, please allow us 1-2 business days to complete this process.  Drug prices often vary depending on where the prescription is filled and some pharmacies may offer cheaper prices.  The website www.goodrx.com contains coupons for medications through different pharmacies. The prices here do not account for what the cost may be with help from insurance (it may be cheaper with your insurance), but the website can give you the price if you did not use any insurance.  - You can print the associated coupon and take it with   your prescription to the pharmacy.  - You may also stop by our office during regular business hours and pick up a GoodRx coupon card.  - If you need your prescription sent electronically to a different pharmacy, notify our office through [place] MyChart or by phone at [phone] option 4.     Si Usted Necesita Algo Despus de Su Visita  Tambin puede enviarnos un mensaje a travs de MyChart. Por lo general respondemos a los mensajes de MyChart en el transcurso de 1 a 2  das hbiles.  Para renovar recetas, por favor pida a su farmacia que se ponga en contacto con nuestra oficina. Nuestro nmero de fax es el [phone].  Si tiene un asunto urgente cuando la clnica est cerrada y que no puede esperar hasta el siguiente da hbil, puede llamar/localizar a su doctor(a) al nmero que aparece a continuacin.   Por favor, tenga en cuenta que aunque hacemos todo lo posible para estar disponibles para asuntos urgentes fuera del horario de oficina, no estamos disponibles las 24 horas del da, los 7 das de la semana.   Si tiene un problema urgente y no puede comunicarse con nosotros, puede optar por buscar atencin mdica  en el consultorio de su doctor(a), en una clnica privada, en un centro de atencin urgente o en una sala de emergencias.  Si tiene una emergencia mdica, por favor llame inmediatamente al 911 o vaya a la sala de emergencias.  Nmeros de bper  - Dr. Kowalski: [phone]  - Dra. Moye: [phone]  - Dra. Stewart: [phone]  En caso de inclemencias del tiempo, por favor llame a nuestra lnea principal al [phone] para una actualizacin sobre el estado de cualquier retraso o cierre.  Consejos para la medicacin en dermatologa: Por favor, guarde las cajas en las que vienen los medicamentos de uso tpico para ayudarle a seguir las instrucciones sobre dnde y cmo usarlos. Las farmacias generalmente imprimen las instrucciones del medicamento slo en las cajas y no directamente en los tubos del medicamento.   Si su medicamento es muy caro, por favor, pngase en contacto con nuestra oficina llamando al [phone] y presione la opcin 4 o envenos un mensaje a travs de MyChart.   No podemos decirle cul ser su copago por los medicamentos por adelantado ya que esto es diferente dependiendo de la cobertura de su seguro. Sin embargo, es posible que podamos encontrar un medicamento sustituto a menor costo o llenar un formulario para que el  seguro cubra el medicamento que se considera necesario.   Si se requiere una autorizacin previa para que su compaa de seguros cubra su medicamento, por favor permtanos de 1 a 2 das hbiles para completar este proceso.  Los precios de los medicamentos varan con frecuencia dependiendo del lugar de dnde se surte la receta y alguna farmacias pueden ofrecer precios ms baratos.  El sitio web www.goodrx.com tiene cupones para medicamentos de diferentes farmacias. Los precios aqu no tienen en cuenta lo que podra costar con la ayuda del seguro (puede ser ms barato con su seguro), pero el sitio web puede darle el precio si no utiliz ningn seguro.  - Puede imprimir el cupn correspondiente y llevarlo con su receta a la farmacia.  - Tambin puede pasar por nuestra oficina durante el horario de atencin regular y recoger una tarjeta de cupones de GoodRx.  - Si necesita que su receta se enve electrnicamente a una farmacia diferente, informe a nuestra oficina a travs de MyChart de [place]   o por telfono llamando al [phone] y presione la opcin 4.  

## 2022-10-10 NOTE — Progress Notes (Signed)
   Follow-Up Visit   Subjective  Pamela Reid is a 66 y.o. female who presents for the following: Seborrheic dermatitis (Of the scalp, ears, and post auricular - cleared with Mometasone solution, pt uses QD PRN) and ISK follow up (Of the R scapula and R med scapula - patient unsure if any residual and would like areas checked today). The patient has spots, moles and lesions to be evaluated, some may be new or changing and the patient has concerns that these could be cancer.  The following portions of the chart were reviewed this encounter and updated as appropriate:   Tobacco  Allergies  Meds  Problems  Med Hx  Surg Hx  Fam Hx     Review of Systems:  No other skin or systemic complaints except as noted in HPI or Assessment and Plan.  Objective  Well appearing patient in no apparent distress; mood and affect are within normal limits.  A focused examination was performed including the face, scalp, trunk, extremities. Relevant physical exam findings are noted in the Assessment and Plan.  Scalp, ears, post auricular Clear.   Assessment & Plan  Seborrheic dermatitis Scalp, ears, post auricular Seborrheic Dermatitis/Psoriasis / SeboPsoriasis vs Eczema   Seborrheic Dermatitis  -  is a chronic persistent rash characterized by pinkness and scaling most commonly of the mid face but also can occur on the scalp (dandruff), ears; mid chest, mid back and groin.  It tends to be exacerbated by stress and cooler weather.  People who have neurologic disease may experience new onset or exacerbation of existing seborrheic dermatitis.  The condition is not curable but treatable and can be controlled.  Continue Mometasone solution to aa's QD-BID PRN up to 5d/wk.  Start Ketoconazole 2% shampoo to aa's QD 3d/wk. Let sit 5-10 minutes before washing off.   ketoconazole (NIZORAL) 2 % shampoo - Scalp, ears, post auricular Shampoo into the scalp let sit 5-10 minutes then wash off. Use 3d/wk. Related  Medications mometasone (ELOCON) 0.1 % lotion Apply topically daily. PRN up to 5 days per week  Seborrheic Keratoses - Stuck-on, waxy, tan-brown papules and/or plaques  - Benign-appearing - Discussed benign etiology and prognosis. - Observe - Call for any changes  Return if symptoms worsen or fail to improve.  Luther Redo, CMA, am acting as scribe for Sarina Ser, MD . Documentation: I have reviewed the above documentation for accuracy and completeness, and I agree with the above.  Sarina Ser, MD

## 2022-10-18 ENCOUNTER — Encounter: Payer: Self-pay | Admitting: Dermatology

## 2023-01-18 NOTE — Patient Instructions (Signed)

## 2023-01-23 ENCOUNTER — Encounter: Payer: Self-pay | Admitting: Nurse Practitioner

## 2023-01-23 ENCOUNTER — Ambulatory Visit (INDEPENDENT_AMBULATORY_CARE_PROVIDER_SITE_OTHER): Payer: Medicare Other | Admitting: Nurse Practitioner

## 2023-01-23 VITALS — BP 118/84 | HR 67 | Temp 97.4°F | Ht 62.99 in | Wt 258.6 lb

## 2023-01-23 DIAGNOSIS — Z1509 Genetic susceptibility to other malignant neoplasm: Secondary | ICD-10-CM | POA: Diagnosis not present

## 2023-01-23 DIAGNOSIS — K219 Gastro-esophageal reflux disease without esophagitis: Secondary | ICD-10-CM | POA: Diagnosis not present

## 2023-01-23 DIAGNOSIS — I10 Essential (primary) hypertension: Secondary | ICD-10-CM

## 2023-01-23 DIAGNOSIS — E7800 Pure hypercholesterolemia, unspecified: Secondary | ICD-10-CM | POA: Diagnosis not present

## 2023-01-23 DIAGNOSIS — Z6841 Body Mass Index (BMI) 40.0 and over, adult: Secondary | ICD-10-CM

## 2023-01-23 NOTE — Assessment & Plan Note (Signed)
Ongoing with use of Nexium.  Continue to monitor and adjust regimen as needed.  Mag level annually.

## 2023-01-23 NOTE — Assessment & Plan Note (Signed)
BMI 45.82 -- has had a few pounds loss.  Recommended eating smaller high protein, low fat meals more frequently and exercising 30 mins a day 5 times a week with a goal of 10-15lb weight loss in the next 3 months. Patient voiced their understanding and motivation to adhere to these recommendations.

## 2023-01-23 NOTE — Progress Notes (Signed)
BP 118/84   Pulse 67   Temp (!) 97.4 F (36.3 C) (Oral)   Ht 5' 2.99" (1.6 m)   Wt 258 lb 9.6 oz (117.3 kg)   SpO2 94%   BMI 45.82 kg/m    Subjective:    Patient ID: Pamela Reid, female    DOB: Jan 28, 1956, 67 y.o.   MRN: [id number]  HPI: Pamela Reid is a 67 y.o. female  Chief Complaint  Patient presents with   Hypertension   Hyperlipidemia   Gastroesophageal Reflux   HYPERTENSION Continues on Losartan-HCTZ 123456 MG and Bystolic 2.5 MG daily. Diagnosed with HTN in her 68's.   Has Lynch syndrome, genetic counseling 10/16/21 -- has EGD and colonoscopy every 2 years for this.   Hypertension status: stable  Satisfied with current treatment? yes Duration of hypertension: chronic BP monitoring frequency: every 1 1/2 weeks BP range:  120/low 80's BP medication side effects:  no Medication compliance: good compliance Aspirin: yes Recurrent headaches: no Visual changes: no Palpitations: no Dyspnea: no Chest pain: no Lower extremity edema: no Dizzy/lightheaded: no  The 10-year ASCVD risk score (Arnett DK, et al., 2019) is: 7.2%   Values used to calculate the score:     Age: 4 years     Sex: Female     Is Non-Hispanic African American: No     Diabetic: No     Tobacco smoker: No     Systolic Blood Pressure: 123456 mmHg     Is BP treated: Yes     HDL Cholesterol: 78 mg/dL     Total Cholesterol: 212 mg/dL   GERD Continues on Nexium for heart burn -- if does not take symptoms recur. GERD control status: stable Satisfied with current treatment? yes Heartburn frequency: none Medication side effects: no  Medication compliance: stable Dysphagia: no Odynophagia:  no Hematemesis: no Blood in stool: no EGD: yes   Relevant past medical, surgical, family and social history reviewed and updated as indicated. Interim medical history since our last visit reviewed. Allergies and medications reviewed and updated.  Review of Systems  Constitutional:  Negative for activity  change, appetite change, diaphoresis, fatigue and fever.  Respiratory:  Negative for cough, chest tightness and shortness of breath.   Cardiovascular:  Negative for chest pain, palpitations and leg swelling.  Gastrointestinal: Negative.   Endocrine: Negative for cold intolerance, heat intolerance, polydipsia, polyphagia and polyuria.  Neurological: Negative.   Psychiatric/Behavioral: Negative.      Per HPI unless specifically indicated above     Objective:    BP 118/84   Pulse 67   Temp (!) 97.4 F (36.3 C) (Oral)   Ht 5' 2.99" (1.6 m)   Wt 258 lb 9.6 oz (117.3 kg)   SpO2 94%   BMI 45.82 kg/m   Wt Readings from Last 3 Encounters:  01/23/23 258 lb 9.6 oz (117.3 kg)  07/23/22 261 lb 12.8 oz (118.8 kg)  01/22/22 254 lb 9.6 oz (115.5 kg)    Physical Exam Vitals and nursing note reviewed.  Constitutional:      General: She is awake. She is not in acute distress.    Appearance: She is well-developed and well-groomed. She is obese. She is not ill-appearing or toxic-appearing.  HENT:     Head: Normocephalic.     Right Ear: Hearing normal.     Left Ear: Hearing normal.  Eyes:     General: Lids are normal.        Right eye: No discharge.  Left eye: No discharge.     Conjunctiva/sclera: Conjunctivae normal.     Pupils: Pupils are equal, round, and reactive to light.  Neck:     Thyroid: No thyromegaly.     Vascular: No carotid bruit or JVD.  Cardiovascular:     Rate and Rhythm: Normal rate and regular rhythm.     Heart sounds: Normal heart sounds. No murmur heard.    No gallop.  Pulmonary:     Effort: Pulmonary effort is normal. No accessory muscle usage or respiratory distress.     Breath sounds: Normal breath sounds.  Abdominal:     General: Bowel sounds are normal.     Palpations: Abdomen is soft.  Musculoskeletal:     Cervical back: Normal range of motion and neck supple.     Right lower leg: No edema.     Left lower leg: No edema.  Lymphadenopathy:      Cervical: No cervical adenopathy.  Skin:    General: Skin is warm and dry.  Neurological:     Mental Status: She is alert and oriented to person, place, and time.     Deep Tendon Reflexes:     Reflex Scores:      Brachioradialis reflexes are 1+ on the right side and 1+ on the left side.      Patellar reflexes are 1+ on the right side and 1+ on the left side. Psychiatric:        Attention and Perception: Attention normal.        Mood and Affect: Mood normal.        Speech: Speech normal.        Behavior: Behavior normal. Behavior is cooperative.        Thought Content: Thought content normal.     Results for orders placed or performed in visit on 07/23/22  Microscopic Examination   Urine  Result Value Ref Range   WBC, UA 0-5 0 - 5 /hpf   RBC, Urine 0-2 0 - 2 /hpf   Epithelial Cells (non renal) 0-10 0 - 10 /hpf   Bacteria, UA None seen None seen/Few  CBC with Differential/Platelet  Result Value Ref Range   WBC 6.6 3.4 - 10.8 x10E3/uL   RBC 4.51 3.77 - 5.28 x10E6/uL   Hemoglobin 13.4 11.1 - 15.9 g/dL   Hematocrit 41.1 34.0 - 46.6 %   MCV 91 79 - 97 fL   MCH 29.7 26.6 - 33.0 pg   MCHC 32.6 31.5 - 35.7 g/dL   RDW 13.2 11.7 - 15.4 %   Platelets 247 150 - 450 x10E3/uL   Neutrophils 74 Not Estab. %   Lymphs 16 Not Estab. %   Monocytes 8 Not Estab. %   Eos 2 Not Estab. %   Basos 0 Not Estab. %   Neutrophils Absolute 4.9 1.4 - 7.0 x10E3/uL   Lymphocytes Absolute 1.1 0.7 - 3.1 x10E3/uL   Monocytes Absolute 0.5 0.1 - 0.9 x10E3/uL   EOS (ABSOLUTE) 0.1 0.0 - 0.4 x10E3/uL   Basophils Absolute 0.0 0.0 - 0.2 x10E3/uL   Immature Granulocytes 0 Not Estab. %   Immature Grans (Abs) 0.0 0.0 - 0.1 x10E3/uL  Comprehensive metabolic panel  Result Value Ref Range   Glucose 106 (H) 70 - 99 mg/dL   BUN 10 8 - 27 mg/dL   Creatinine, Ser 0.49 (L) 0.57 - 1.00 mg/dL   eGFR 103 >59 mL/min/1.73   BUN/Creatinine Ratio 20 12 - 28   Sodium 138 134 -  144 mmol/L   Potassium 4.1 3.5 - 5.2 mmol/L    Chloride 99 96 - 106 mmol/L   CO2 24 20 - 29 mmol/L   Calcium 9.6 8.7 - 10.3 mg/dL   Total Protein 6.2 6.0 - 8.5 g/dL   Albumin 4.1 3.9 - 4.9 g/dL   Globulin, Total 2.1 1.5 - 4.5 g/dL   Albumin/Globulin Ratio 2.0 1.2 - 2.2   Bilirubin Total 0.4 0.0 - 1.2 mg/dL   Alkaline Phosphatase 93 44 - 121 IU/L   AST 14 0 - 40 IU/L   ALT 17 0 - 32 IU/L  Lipid Panel w/o Chol/HDL Ratio  Result Value Ref Range   Cholesterol, Total 212 (H) 100 - 199 mg/dL   Triglycerides 129 0 - 149 mg/dL   HDL 78 >39 mg/dL   VLDL Cholesterol Cal 22 5 - 40 mg/dL   LDL Chol Calc (NIH) 112 (H) 0 - 99 mg/dL  TSH  Result Value Ref Range   TSH 2.780 0.450 - 4.500 uIU/mL  Amylase  Result Value Ref Range   Amylase 49 31 - 110 U/L  Lipase  Result Value Ref Range   Lipase 23 14 - 72 U/L  Urinalysis, Routine w reflex microscopic  Result Value Ref Range   Specific Gravity, UA 1.015 1.005 - 1.030   pH, UA 7.0 5.0 - 7.5   Color, UA Yellow Yellow   Appearance Ur Clear Clear   Leukocytes,UA Negative Negative   Protein,UA Negative Negative/Trace   Glucose, UA Negative Negative   Ketones, UA Negative Negative   RBC, UA Trace (A) Negative   Bilirubin, UA Negative Negative   Urobilinogen, Ur 0.2 0.2 - 1.0 mg/dL   Nitrite, UA Negative Negative   Microscopic Examination See below:   Magnesium  Result Value Ref Range   Magnesium 2.0 1.6 - 2.3 mg/dL      Assessment & Plan:   Problem List Items Addressed This Visit       Cardiovascular and Mediastinum   Hypertension, essential    Chronic, stable.  BP at goal in office today  Recommend she monitor BP at least a few mornings a week at home and document.  DASH diet at home.  Continue current medication regimen and adjust as needed.  Refills up to date.  Labs: CMP. Return in 6 months.      Relevant Orders   Comprehensive metabolic panel     Digestive   GERD (gastroesophageal reflux disease)    Ongoing with use of Nexium.  Continue to monitor and adjust regimen as  needed.  Mag level annually.        Other   Elevated low density lipoprotein (LDL) cholesterol level    Ongoing.  Noted on past labs.  Continue diet and exercise focus.  Recheck labs today.  ASCVD 7.2% at this time, if ongoing elevation will recommend statin use.  Is eating more fish.      Relevant Orders   Comprehensive metabolic panel   Lipid Panel w/o Chol/HDL Ratio   Obesity - Primary    BMI 45.82 -- has had a few pounds loss.  Recommended eating smaller high protein, low fat meals more frequently and exercising 30 mins a day 5 times a week with a goal of 10-15lb weight loss in the next 3 months. Patient voiced their understanding and motivation to adhere to these recommendations.       PMS2-related Lynch syndrome (HNPCC4)    Chronic.  Genetic testing on 10/16/21 performed.  Continue to follow with GI for scheduled EGD and colonoscopies.  Amylase and Lipase + CMP at annual physical.        Follow up plan: Return in about 6 months (around 07/24/2023) for Annual physical.

## 2023-01-23 NOTE — Assessment & Plan Note (Signed)
Chronic, stable.  BP at goal in office today  Recommend she monitor BP at least a few mornings a week at home and document.  DASH diet at home.  Continue current medication regimen and adjust as needed.  Refills up to date.  Labs: CMP. Return in 6 months.

## 2023-01-23 NOTE — Assessment & Plan Note (Signed)
Chronic.  Genetic testing on 10/16/21 performed.  Continue to follow with GI for scheduled EGD and colonoscopies.  Amylase and Lipase + CMP at annual physical.

## 2023-01-23 NOTE — Assessment & Plan Note (Signed)
Ongoing.  Noted on past labs.  Continue diet and exercise focus.  Recheck labs today.  ASCVD 7.2% at this time, if ongoing elevation will recommend statin use.  Is eating more fish.

## 2023-01-24 ENCOUNTER — Encounter: Payer: Self-pay | Admitting: Nurse Practitioner

## 2023-01-24 DIAGNOSIS — E7800 Pure hypercholesterolemia, unspecified: Secondary | ICD-10-CM

## 2023-01-24 LAB — COMPREHENSIVE METABOLIC PANEL
ALT: 12 IU/L (ref 0–32)
AST: 13 IU/L (ref 0–40)
Albumin/Globulin Ratio: 1.9 (ref 1.2–2.2)
Albumin: 4.4 g/dL (ref 3.9–4.9)
Alkaline Phosphatase: 91 IU/L (ref 44–121)
BUN/Creatinine Ratio: 14 (ref 12–28)
BUN: 9 mg/dL (ref 8–27)
Bilirubin Total: 0.6 mg/dL (ref 0.0–1.2)
CO2: 24 mmol/L (ref 20–29)
Calcium: 9.5 mg/dL (ref 8.7–10.3)
Chloride: 100 mmol/L (ref 96–106)
Creatinine, Ser: 0.63 mg/dL (ref 0.57–1.00)
Globulin, Total: 2.3 g/dL (ref 1.5–4.5)
Glucose: 107 mg/dL — ABNORMAL HIGH (ref 70–99)
Potassium: 3.9 mmol/L (ref 3.5–5.2)
Sodium: 142 mmol/L (ref 134–144)
Total Protein: 6.7 g/dL (ref 6.0–8.5)
eGFR: 97 mL/min/{1.73_m2} (ref >59)

## 2023-01-24 LAB — LIPID PANEL W/O CHOL/HDL RATIO
Cholesterol, Total: 221 mg/dL — ABNORMAL HIGH (ref 100–199)
HDL: 71 mg/dL (ref >39)
LDL Chol Calc (NIH): 128 mg/dL — ABNORMAL HIGH (ref 0–99)
Triglycerides: 127 mg/dL (ref 0–149)
VLDL Cholesterol Cal: 22 mg/dL (ref 5–40)

## 2023-01-24 NOTE — Progress Notes (Signed)
Contacted via [place] [facility] [place], your labs have returned: - Kidney function, creatinine and eGFR, remains normal, as is liver function, AST and ALT.  - Cholesterol labs remain elevated, at baseline for you, and ASCVD 7.6% -- this is level where you may benefit cholesterol medication for heart protection and stroke prevention.  Even a low dose of Rosuvastatin may be beneficial.  Think about this and let me know if it is something you would like to try.  Any questions? The 10-year ASCVD risk score (Arnett DK, et al., 2019) is: 7.6%   Values used to calculate the score:     Age: 67 years     Sex: Female     Is Non-Hispanic African American: No     Diabetic: No     Tobacco smoker: No     Systolic Blood Pressure: 123456 mmHg     Is BP treated: Yes     HDL Cholesterol: 71 mg/dL     Total Cholesterol: 221 mg/dL Keep being amazing!!  Thank you for allowing me to participate in your care.  I appreciate you. Kindest regards, Kaylamarie Swickard

## 2023-01-26 ENCOUNTER — Encounter: Payer: Self-pay | Admitting: Nurse Practitioner

## 2023-01-26 MED ORDER — ROSUVASTATIN CALCIUM 10 MG PO TABS: 10.0000 mg | ORAL_TABLET | Freq: Every day | ORAL | 3 refills | Status: DC

## 2023-01-27 IMAGING — MG MM DIGITAL SCREENING BILAT W/ TOMO AND CAD
6 of 10 series · 6 of 30 positions shown · non-contrast
Comparison: Previous exam(s).

CLINICAL DATA: Screening.

EXAM:
DIGITAL SCREENING BILATERAL MAMMOGRAM WITH TOMOSYNTHESIS AND CAD
TECHNIQUE: Bilateral screening digital craniocaudal and mediolateral oblique
mammograms were obtained. Bilateral screening digital breast
tomosynthesis was performed. The images were evaluated with
computer-aided detection.

[R MLO synth-2D]
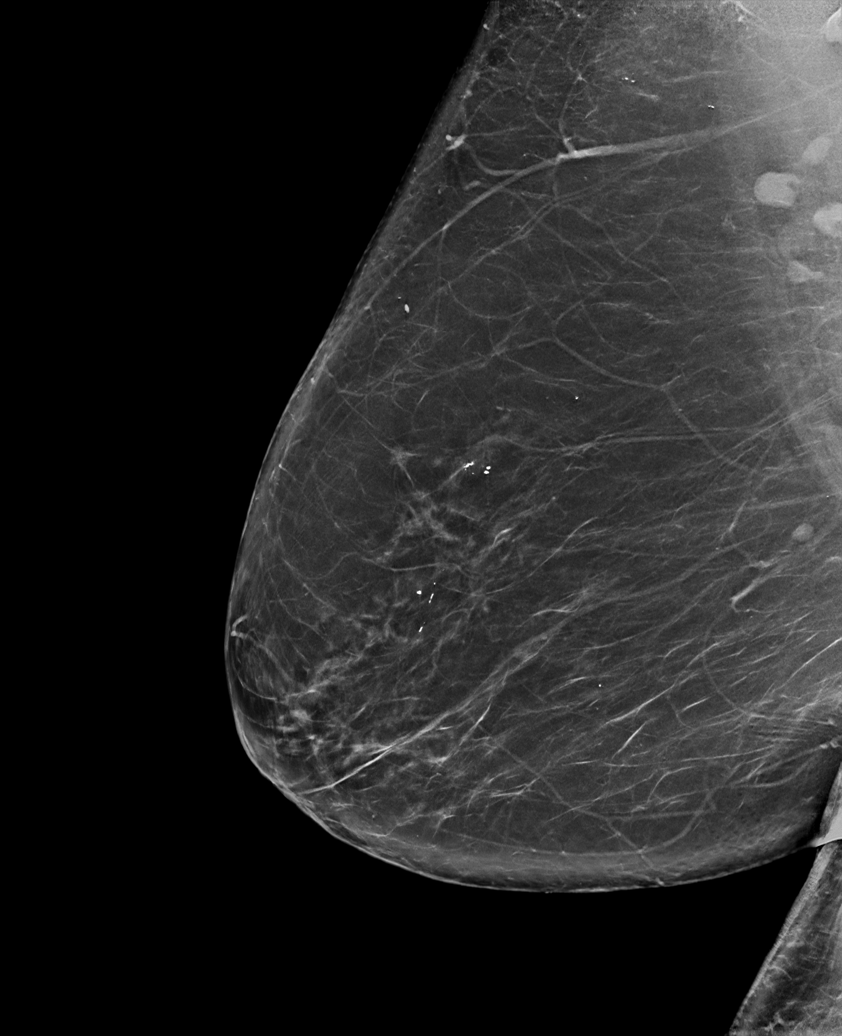

[L MLO synth-2D]
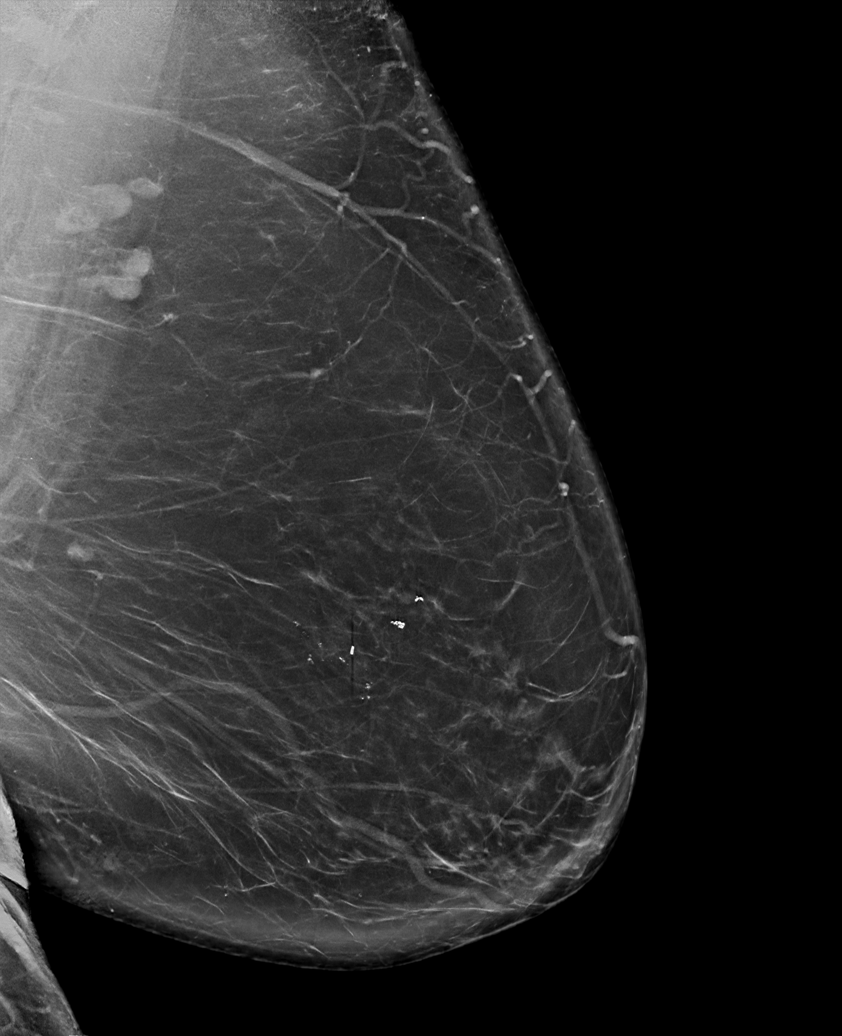

[R CC synth-2D (1 of 2)]
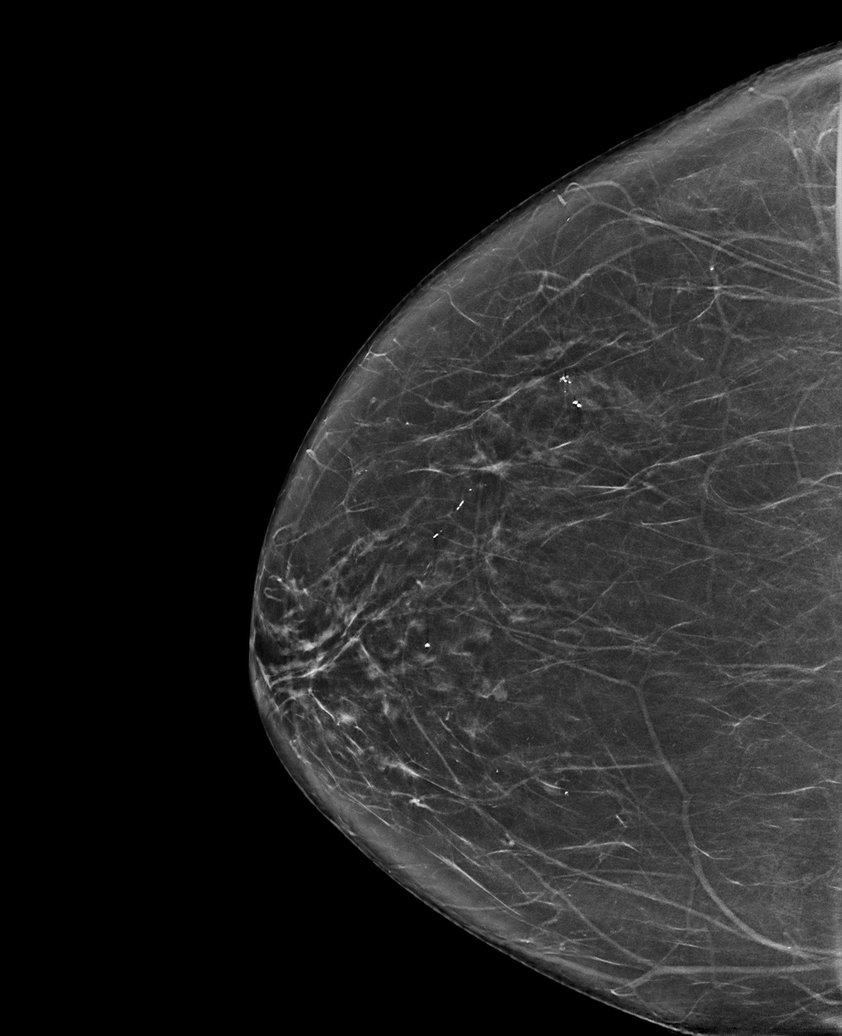

[R CC synth-2D (2 of 2)]
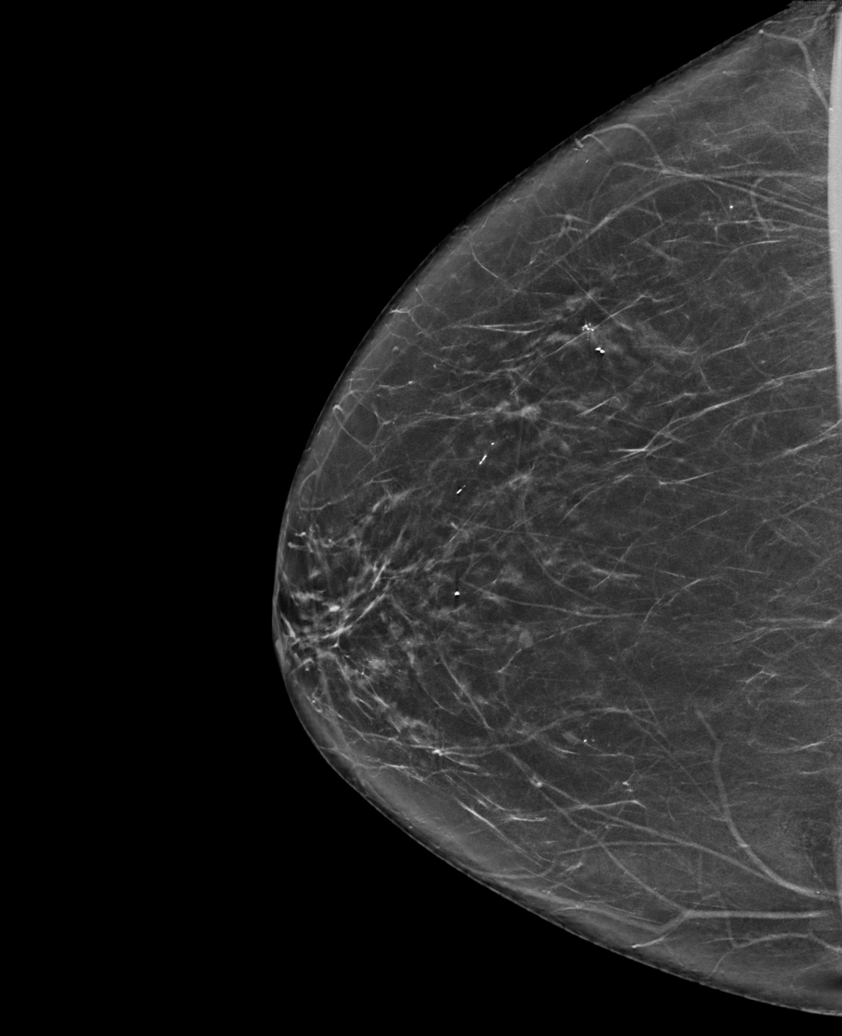

[L CC synth-2D]
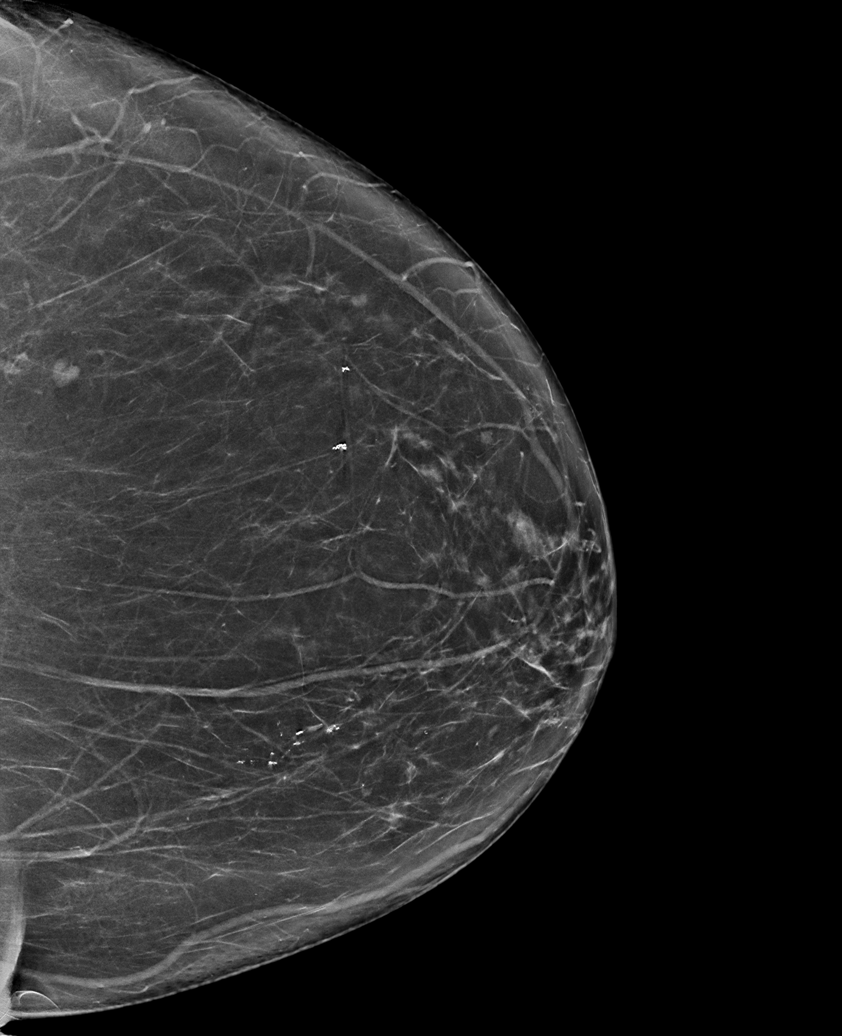

[R MLO tomo · tomo slice 45/90.0]
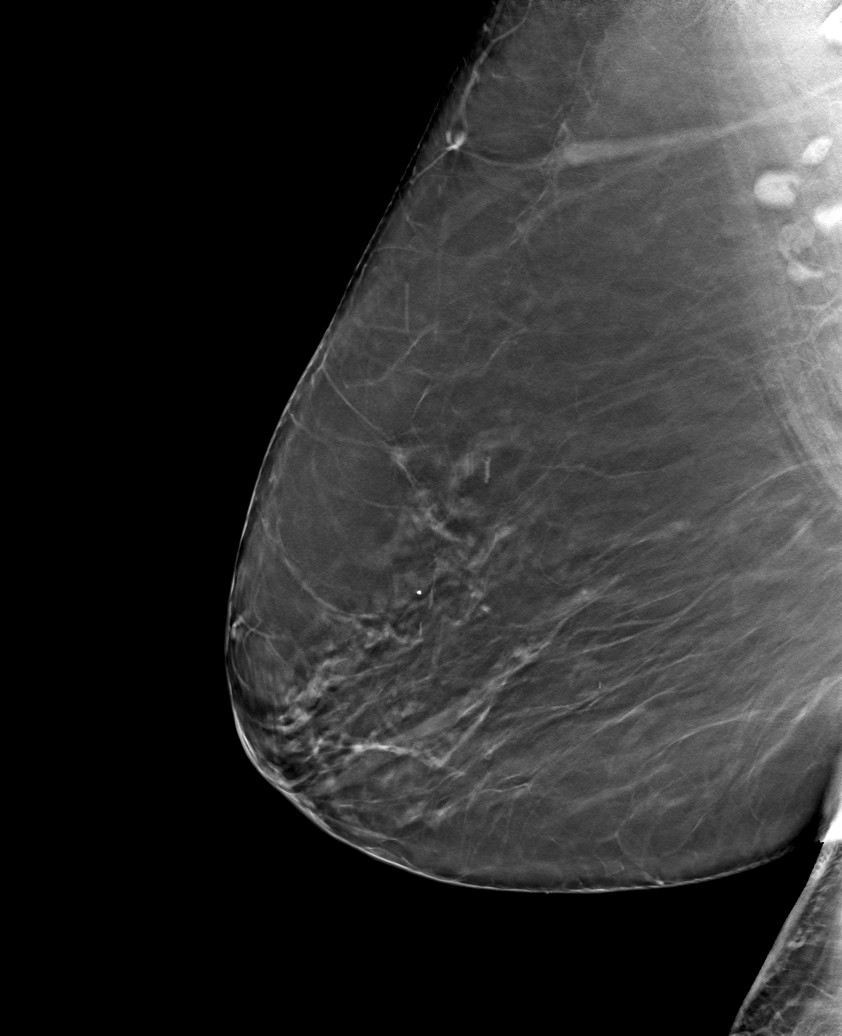

[6 of 30 positions shown; findings below may reference images not displayed]

ACR Breast Density Category b: There are scattered areas of
fibroglandular density.
FINDINGS: There are no findings suspicious for malignancy.
IMPRESSION: No mammographic evidence of malignancy. A result letter of this
screening mammogram will be mailed directly to the patient.

RECOMMENDATION:
Screening mammogram in one year. (Code:51-O-LD2)

BI-RADS CATEGORY  1: Negative.

## 2023-02-25 NOTE — Addendum Note (Signed)
Addended by: Marnee Guarneri T on: 02/25/2023 12:33 PM   Modules accepted: Orders

## 2023-03-09 DIAGNOSIS — E7800 Pure hypercholesterolemia, unspecified: Secondary | ICD-10-CM | POA: Diagnosis not present

## 2023-03-23 ENCOUNTER — Other Ambulatory Visit: Payer: Medicare Other

## 2023-05-01 ENCOUNTER — Other Ambulatory Visit: Payer: Self-pay

## 2023-05-01 MED ORDER — ROSUVASTATIN CALCIUM 10 MG PO TABS: 10.0000 mg | ORAL_TABLET | Freq: Every day | ORAL | 4 refills | Status: DC

## 2023-06-30 ENCOUNTER — Other Ambulatory Visit: Payer: Self-pay | Admitting: Nurse Practitioner

## 2023-06-30 NOTE — Telephone Encounter (Signed)
Requested Prescriptions  Refused Prescriptions Disp Refills   losartan-hydrochlorothiazide (HYZAAR) 100-25 MG tablet [Pharmacy Med Name: LOSARTAN-HCTZ 100-25 MG TAB] 90 tablet 4    Sig: TAKE 1 TABLET BY MOUTH EVERY DAY     Cardiovascular: ARB + Diuretic Combos Passed - 06/30/2023  2:33 AM      Passed - K in normal range and within 180 days    Potassium  Date Value Ref Range Status  01/23/2023 3.9 3.5 - 5.2 mmol/L Final         Passed - Na in normal range and within 180 days    Sodium  Date Value Ref Range Status  01/23/2023 142 134 - 144 mmol/L Final         Passed - Cr in normal range and within 180 days    Creatinine, Ser  Date Value Ref Range Status  01/23/2023 0.63 0.57 - 1.00 mg/dL Final         Passed - eGFR is 10 or above and within 180 days    eGFR  Date Value Ref Range Status  01/23/2023 97 >59 mL/min/1.73 Final         Passed - Patient is not pregnant      Passed - Last BP in normal range    BP Readings from Last 1 Encounters:  01/23/23 118/84         Passed - Valid encounter within last 6 months    Recent Outpatient Visits           5 months ago Class 3 severe obesity due to excess calories without serious comorbidity with body mass index (BMI) of 40.0 to 44.9 in adult [facility])   [place] [facility] [place], Jolene T, NP   11 months ago Class 3 severe obesity due to excess calories without serious comorbidity with body mass index (BMI) of 40.0 to 44.9 in adult [facility])   [place] [facility] [place], Corrie Dandy T, NP   1 year ago Class 3 severe obesity due to excess calories without serious comorbidity with body mass index (BMI) of 40.0 to 44.9 in adult [facility])   [place] [facility] [place], Corrie Dandy T, NP   1 year ago Hypertension, essential   [place] [facility] [place], Corrie Dandy T, NP   2 years ago Encounter to establish care   [place] [facility] [place], Dorie Rank, NP       Future  Appointments             In 2 months [place], Dorie Rank, NP [facility] [facility]

## 2023-07-14 ENCOUNTER — Ambulatory Visit (INDEPENDENT_AMBULATORY_CARE_PROVIDER_SITE_OTHER): Payer: Medicare Other | Admitting: Emergency Medicine

## 2023-07-14 VITALS — Ht 63.0 in | Wt 251.0 lb

## 2023-07-14 DIAGNOSIS — Z0000 Encounter for general adult medical examination without abnormal findings: Secondary | ICD-10-CM

## 2023-07-14 NOTE — Patient Instructions (Addendum)
Pamela Reid , Thank you for taking time to come for your Medicare Wellness Visit. I appreciate your ongoing commitment to your health goals. Please review the following plan we discussed and let me know if I can assist you in the future.   Referrals/Orders/Follow-Ups/Clinician Recommendations: Get the flu shot in the fall.  This is a list of the screening recommended for you and due dates:  Health Maintenance  Topic Date Due   COVID-19 Vaccine (9 - 2023-24 season) 04/20/2023   Flu Shot  07/02/2023   Colon Cancer Screening  09/12/2023   Mammogram  12/31/2023   Medicare Annual Wellness Visit  07/13/2024   DTaP/Tdap/Td vaccine (3 - Td or Tdap) 09/14/2030   DEXA scan (bone density measurement)  12/31/2031   Pneumonia Vaccine  Completed   Hepatitis C Screening  Completed   Zoster (Shingles) Vaccine  Completed   HPV Vaccine  Aged Out    Advanced directives: (Copy Requested) Please bring a copy of your health care power of attorney and living will to the office to be added to your chart at your convenience.  Next Medicare Annual Wellness Visit scheduled for next year: Yes, 07/19/24 @ 2:15pm  Preventive Care 65 Years and Older, Female Preventive care refers to lifestyle choices and visits with your health care provider that can promote health and wellness. What does preventive care include? A yearly physical exam. This is also called an annual well check. Dental exams once or twice a year. Routine eye exams. Ask your health care provider how often you should have your eyes checked. Personal lifestyle choices, including: Daily care of your teeth and gums. Regular physical activity. Eating a healthy diet. Avoiding tobacco and drug use. Limiting alcohol use. Practicing safe sex. Taking low-dose aspirin every day. Taking vitamin and mineral supplements as recommended by your health care provider. What happens during an annual well check? The services and screenings done by your health care  provider during your annual well check will depend on your age, overall health, lifestyle risk factors, and family history of disease. Counseling  Your health care provider may ask you questions about your: Alcohol use. Tobacco use. Drug use. Emotional well-being. Home and relationship well-being. Sexual activity. Eating habits. History of falls. Memory and ability to understand (cognition). Work and work Astronomer. Reproductive health. Screening  You may have the following tests or measurements: Height, weight, and BMI. Blood pressure. Lipid and cholesterol levels. These may be checked every 5 years, or more frequently if you are over 107 years old. Skin check. Lung cancer screening. You may have this screening every year starting at age 47 if you have a 30-pack-year history of smoking and currently smoke or have quit within the past 15 years. Fecal occult blood test (FOBT) of the stool. You may have this test every year starting at age 1. Flexible sigmoidoscopy or colonoscopy. You may have a sigmoidoscopy every 5 years or a colonoscopy every 10 years starting at age 74. Hepatitis C blood test. Hepatitis B blood test. Sexually transmitted disease (STD) testing. Diabetes screening. This is done by checking your blood sugar (glucose) after you have not eaten for a while (fasting). You may have this done every 1-3 years. Bone density scan. This is done to screen for osteoporosis. You may have this done starting at age 3. Mammogram. This may be done every 1-2 years. Talk to your health care provider about how often you should have regular mammograms. Talk with your health care provider about your  test results, treatment options, and if necessary, the need for more tests. Vaccines  Your health care provider may recommend certain vaccines, such as: Influenza vaccine. This is recommended every year. Tetanus, diphtheria, and acellular pertussis (Tdap, Td) vaccine. You may need a Td  booster every 10 years. Zoster vaccine. You may need this after age 46. Pneumococcal 13-valent conjugate (PCV13) vaccine. One dose is recommended after age 45. Pneumococcal polysaccharide (PPSV23) vaccine. One dose is recommended after age 21. Talk to your health care provider about which screenings and vaccines you need and how often you need them. This information is not intended to replace advice given to you by your health care provider. Make sure you discuss any questions you have with your health care provider. Document Released: 12/14/2015 Document Revised: 08/06/2016 Document Reviewed: 09/18/2015 Elsevier Interactive Patient Education  2017 [employer].  Fall Prevention in the Home Falls can cause injuries. They can happen to people of all ages. There are many things you can do to make your home safe and to help prevent falls. What can I do on the outside of my home? Regularly fix the edges of walkways and driveways and fix any cracks. Remove anything that might make you trip as you walk through a door, such as a raised step or threshold. Trim any bushes or trees on the path to your home. Use bright outdoor lighting. Clear any walking paths of anything that might make someone trip, such as rocks or tools. Regularly check to see if handrails are loose or broken. Make sure that both sides of any steps have handrails. Any raised decks and porches should have guardrails on the edges. Have any leaves, snow, or ice cleared regularly. Use sand or salt on walking paths during winter. Clean up any spills in your garage right away. This includes oil or grease spills. What can I do in the bathroom? Use night lights. Install grab bars by the toilet and in the tub and shower. Do not use towel bars as grab bars. Use non-skid mats or decals in the tub or shower. If you need to sit down in the shower, use a plastic, non-slip stool. Keep the floor dry. Clean up any water that spills on the floor  as soon as it happens. Remove soap buildup in the tub or shower regularly. Attach bath mats securely with double-sided non-slip rug tape. Do not have throw rugs and other things on the floor that can make you trip. What can I do in the bedroom? Use night lights. Make sure that you have a light by your bed that is easy to reach. Do not use any sheets or blankets that are too big for your bed. They should not hang down onto the floor. Have a firm chair that has side arms. You can use this for support while you get dressed. Do not have throw rugs and other things on the floor that can make you trip. What can I do in the kitchen? Clean up any spills right away. Avoid walking on wet floors. Keep items that you use a lot in easy-to-reach places. If you need to reach something above you, use a strong step stool that has a grab bar. Keep electrical cords out of the way. Do not use floor polish or wax that makes floors slippery. If you must use wax, use non-skid floor wax. Do not have throw rugs and other things on the floor that can make you trip. What can I do with my  stairs? Do not leave any items on the stairs. Make sure that there are handrails on both sides of the stairs and use them. Fix handrails that are broken or loose. Make sure that handrails are as long as the stairways. Check any carpeting to make sure that it is firmly attached to the stairs. Fix any carpet that is loose or worn. Avoid having throw rugs at the top or bottom of the stairs. If you do have throw rugs, attach them to the floor with carpet tape. Make sure that you have a light switch at the top of the stairs and the bottom of the stairs. If you do not have them, ask someone to add them for you. What else can I do to help prevent falls? Wear shoes that: Do not have high heels. Have rubber bottoms. Are comfortable and fit you well. Are closed at the toe. Do not wear sandals. If you use a stepladder: Make sure that it is  fully opened. Do not climb a closed stepladder. Make sure that both sides of the stepladder are locked into place. Ask someone to hold it for you, if possible. Clearly mark and make sure that you can see: Any grab bars or handrails. First and last steps. Where the edge of each step is. Use tools that help you move around (mobility aids) if they are needed. These include: Canes. Walkers. Scooters. Crutches. Turn on the lights when you go into a dark area. Replace any light bulbs as soon as they burn out. Set up your furniture so you have a clear path. Avoid moving your furniture around. If any of your floors are uneven, fix them. If there are any pets around you, be aware of where they are. Review your medicines with your doctor. Some medicines can make you feel dizzy. This can increase your chance of falling. Ask your doctor what other things that you can do to help prevent falls. This information is not intended to replace advice given to you by your health care provider. Make sure you discuss any questions you have with your health care provider. Document Released: 09/13/2009 Document Revised: 04/24/2016 Document Reviewed: 12/22/2014 Elsevier Interactive Patient Education  2017 [employer].

## 2023-07-14 NOTE — Progress Notes (Signed)
Subjective:   Pamela Reid is a 67 y.o. female who presents for Medicare Annual (Subsequent) preventive examination.  Visit Complete: Virtual  I connected with  Pamela Reid on 07/14/23 by a audio enabled telemedicine application and verified that I am speaking with the correct person using two identifiers.  Patient Location: Home  Provider Location: Office/Clinic  I discussed the limitations of evaluation and management by telemedicine. The patient expressed understanding and agreed to proceed.  Patient Medicare AWV questionnaire was completed by the patient on 07/10/23; I have confirmed that all information answered by patient is correct and no changes since this date.  Vital Signs: Unable to obtain new vitals due to this being a telehealth visit. Patient reported height and weight.   Review of Systems     Cardiac Risk Factors include: advanced age (>60men, >45 women);hypertension;dyslipidemia;obesity (BMI >30kg/m2)     Objective:    Today's Vitals   07/14/23 1512  Weight: 251 lb (113.9 kg)  Height: 5\' 3"  (1.6 m)   Body mass index is 44.46 kg/m.     07/14/2023    3:20 PM 07/02/2022    8:50 AM 09/11/2021    6:55 AM  Advanced Directives  Does Patient Have a Medical Advance Directive? Yes No Yes  Type of Estate agent of [place];Living will  Healthcare Power of [place];Living will  Does patient want to make changes to medical advance directive? No - Patient declined    Copy of Healthcare Power of Attorney in Chart? No - copy requested    Would patient like information on creating a medical advance directive?  No - Patient declined     Current Medications (verified) Outpatient Encounter Medications as of 07/14/2023  Medication Sig   Aspirin 81 MG CAPS Take by mouth.   Calcium Polycarbophil (FIBER) 625 MG TABS Take by mouth.   Cholecalciferol 75 MCG (3000 UT) TABS Take by mouth.   co-enzyme Q-10 30 MG capsule Take by mouth.   esomeprazole  (NEXIUM) 20 MG capsule Take by mouth.   ibuprofen (ADVIL) 200 MG tablet Take by mouth.   ketoconazole (NIZORAL) 2 % shampoo Shampoo into the scalp let sit 5-10 minutes then wash off. Use 3d/wk.   losartan-hydrochlorothiazide (HYZAAR) 100-25 MG tablet Take 1 tablet by mouth daily.   mometasone (ELOCON) 0.1 % lotion Apply topically daily. PRN up to 5 days per week   Multiple Vitamin (MULTI-VITAMINS) TABS Take 1 tablet by mouth daily.   nebivolol (BYSTOLIC) 2.5 MG tablet TAKE 1 TABLET DAILY BY MOUTH FOR HYPERTENSION   rosuvastatin (CRESTOR) 10 MG tablet Take 1 tablet (10 mg total) by mouth daily.   No facility-administered encounter medications on file as of 07/14/2023.    Allergies (verified) Cephalexin and Penicillins   History: Past Medical History:  Diagnosis Date   Allergy    Cancer (HCC)    Family history of breast cancer    Family history of melanoma    GERD (gastroesophageal reflux disease)    Hypertension    Lynch syndrome    Past Surgical History:  Procedure Laterality Date   ABDOMINAL HYSTERECTOMY     BREAST BIOPSY Left 2019   stereotatic bx neg   CHOLECYSTECTOMY     COLONOSCOPY WITH PROPOFOL N/A 09/11/2021   Procedure: COLONOSCOPY WITH PROPOFOL;  Surgeon: Toney Reil, MD;  Location: [facility] ENDOSCOPY;  Service: Gastroenterology;  Laterality: N/A;   Family History  Problem Relation Age of Onset   Hypertension Mother    Melanoma  Mother    Parkinson's disease Father    Dementia Father    Healthy Sister    Healthy Brother    Breast cancer Maternal Aunt        dx 38s   Melanoma Paternal Aunt    Cancer Maternal Grandmother        "GI cancer" dx 48s   Cancer Maternal Grandfather        "GI cancer" dx 60s   Breast cancer Paternal Grandmother        dx 30s   Parkinson's disease Paternal Grandfather    Healthy Daughter    Healthy Son    Social History   Socioeconomic History   Marital status: Married    Spouse name: Loraine Leriche   Number of children: 2   Years  of education: Not on file   Highest education level: Not on file  Occupational History   Occupation: part time Catering manager & teaching  Tobacco Use   Smoking status: Never   Smokeless tobacco: Never  Vaping Use   Vaping status: Never Used  Substance and Sexual Activity   Alcohol use: Yes    Alcohol/week: 1.0 standard drink of alcohol    Types: 1 Glasses of wine per week    Comment: occasional   Drug use: Never   Sexual activity: Yes  Other Topics Concern   Not on file  Social History Narrative   Married, 2 children and 7 grandchildren   Social Determinants of Health   Financial Resource Strain: Low Risk  (07/10/2023)   Overall Financial Resource Strain (CARDIA)    Difficulty of Paying Living Expenses: Not hard at all  Food Insecurity: No Food Insecurity (07/10/2023)   Hunger Vital Sign    Worried About Running Out of Food in the Last Year: Never true    Ran Out of Food in the Last Year: Never true  Transportation Needs: No Transportation Needs (07/10/2023)   PRAPARE - Administrator, Civil Service (Medical): No    Lack of Transportation (Non-Medical): No  Physical Activity: Sufficiently Active (07/10/2023)   Exercise Vital Sign    Days of Exercise per Week: 4 days    Minutes of Exercise per Session: 60 min  Stress: No Stress Concern Present (07/10/2023)   [employer] of Occupational Health - Occupational Stress Questionnaire    Feeling of Stress : Not at all  Social Connections: Moderately Integrated (07/10/2023)   Social Connection and Isolation Panel [NHANES]    Frequency of Communication with Friends and Family: More than three times a week    Frequency of Social Gatherings with Friends and Family: More than three times a week    Attends Religious Services: Never    Database administrator or Organizations: Yes    Attends Engineer, structural: More than 4 times per year    Marital Status: Married    Tobacco Counseling Counseling given: Not  Answered   Clinical Intake:  Pre-visit preparation completed: Yes  Pain : No/denies pain     BMI - recorded: 44.46 Nutritional Status: BMI > 30  Obese Nutritional Risks: None Diabetes: No  How often do you need to have someone help you when you read instructions, pamphlets, or other written materials from your doctor or pharmacy?: 1 - Never  Interpreter Needed?: No  Information entered by :: Tora Kindred, CMA   Activities of Daily Living    07/10/2023   12:17 PM  In your present state of health, do you  have any difficulty performing the following activities:  Hearing? 0  Vision? 0  Difficulty concentrating or making decisions? 0  Walking or climbing stairs? 0  Dressing or bathing? 0  Doing errands, shopping? 0  Preparing Food and eating ? N  Using the Toilet? N  In the past six months, have you accidently leaked urine? N  Do you have problems with loss of bowel control? N  Managing your Medications? N  Managing your Finances? N  Housekeeping or managing your Housekeeping? N    Patient Care Team: Marjie Skiff, NP as PCP - General (Nurse Practitioner)  Indicate any recent Medical Services you may have received from other than [facility] providers in the past year (date may be approximate).     Assessment:   This is a routine wellness examination for Pamela Reid.  Hearing/Vision screen Hearing Screening - Comments:: Denies hearing loss Vision Screening - Comments:: Has eye exam yearly  Dietary issues and exercise activities discussed:     Goals Addressed               This Visit's Progress     DIET - EAT MORE FRUITS AND VEGETABLES (pt-stated)        Weight (lb) < 200 lb (90.7 kg)   251 lb (113.9 kg)     Depression Screen    07/14/2023    3:19 PM 01/23/2023    8:18 AM 07/23/2022    8:11 AM 07/02/2022    8:37 AM 01/22/2022    8:29 AM 07/22/2021    8:10 AM 05/03/2021    8:40 AM  PHQ 2/9 Scores  PHQ - 2 Score 0 0 0 0 0 0 0  PHQ- 9 Score 0 1 3  1       Fall  Risk    07/10/2023   12:17 PM 01/23/2023    8:18 AM 07/23/2022    8:11 AM 01/22/2022    8:29 AM 05/03/2021    8:40 AM  Fall Risk   Falls in the past year? 0 0 0 0 0  Number falls in past yr: 0 0 0 0 0  Injury with Fall? 0 0 0 0 0  Risk for fall due to : No Fall Risks No Fall Risks No Fall Risks No Fall Risks No Fall Risks  Follow up Falls prevention discussed Falls evaluation completed Falls evaluation completed Falls evaluation completed Falls evaluation completed    MEDICARE RISK AT HOME:   TIMED UP AND GO:  Was the test performed?  No    Cognitive Function:        07/14/2023    3:22 PM 07/02/2022    9:47 AM  6CIT Screen  What Year? 0 points 0 points  What month? 0 points 0 points  What time? 0 points 0 points  Count back from 20 0 points 0 points  Months in reverse 0 points 0 points  Repeat phrase 0 points 0 points  Total Score 0 points 0 points    Immunizations Immunization History  Administered Date(s) Administered   COVID-19, mRNA, vaccine(Comirnaty)12 years and older 02/23/2023   Fluad Quad(high Dose 65+) 08/25/2022   Influenza Split 08/25/2018, 09/08/2019   Influenza, High Dose Seasonal PF 08/15/2021   Influenza,inj,Quad PF,6+ Mos 08/30/2017   Influenza,inj,quad, With Preservative 10/14/2010   PFIZER Comirnaty(Gray Top)Covid-19 Tri-Sucrose Vaccine 08/25/2022   PFIZER(Purple Top)SARS-COV-2 Vaccination 12/12/2019, 01/02/2020, 08/27/2020, 03/11/2021, 04/23/2022   PPD Test 10/13/2019, 09/14/2020   Pfizer Covid-19 Vaccine Bivalent Booster 75yrs & up 08/15/2021  Pneumococcal Conjugate-13 07/22/2021   Pneumococcal Polysaccharide-23 07/23/2022   Tdap 12/31/2011, 09/14/2020   Zoster Recombinant(Shingrix) 12/23/2020, 03/11/2021   Zoster, Live 02/06/2014    TDAP status: Up to date  Flu Vaccine status: Due, Education has been provided regarding the importance of this vaccine. Advised may receive this vaccine at local pharmacy or Health Dept. Aware to provide a copy of  the vaccination record if obtained from local pharmacy or Health Dept. Verbalized acceptance and understanding.  Pneumococcal vaccine status: Up to date  Covid-19 vaccine status: Information provided on how to obtain vaccines.   Qualifies for Shingles Vaccine? Yes   Zostavax completed No   Shingrix Completed?: Yes  Screening Tests Health Maintenance  Topic Date Due   COVID-19 Vaccine (9 - 2023-24 season) 04/20/2023   INFLUENZA VACCINE  07/02/2023   Colonoscopy  09/12/2023   MAMMOGRAM  12/31/2023   Medicare Annual Wellness (AWV)  07/13/2024   DTaP/Tdap/Td (3 - Td or Tdap) 09/14/2030   DEXA SCAN  12/31/2031   Pneumonia Vaccine 88+ Years old  Completed   Hepatitis C Screening  Completed   Zoster Vaccines- Shingrix  Completed   HPV VACCINES  Aged Out    Health Maintenance  Health Maintenance Due  Topic Date Due   COVID-19 Vaccine (9 - 2023-24 season) 04/20/2023   INFLUENZA VACCINE  07/02/2023   Colonoscopy  09/12/2023    Colorectal cancer screening: Type of screening: Colonoscopy. Completed 09/11/21. Repeat every 3 years  Mammogram status: Completed 12/30/21. Repeat every year. Repeat every 2 years per patient.  Bone Density status: Completed 12/30/21. Results reflect: Bone density results: NORMAL. Repeat every 10 years.  Lung Cancer Screening: (Low Dose CT Chest recommended if Age 70-80 years, 20 pack-year currently smoking OR have quit w/in 15years.) does not qualify.   Lung Cancer Screening Referral: n/a  Additional Screening:  Hepatitis C Screening: does not qualify; Completed 12/23/15  Vision Screening: Recommended annual ophthalmology exams for early detection of glaucoma and other disorders of the eye.  Dental Screening: Recommended annual dental exams for proper oral hygiene    Community Resource Referral / Chronic Care Management: CRR required this visit?  No   CCM required this visit?  No     Plan:     I have personally reviewed and noted the  following in the patient's chart:   Medical and social history Use of alcohol, tobacco or illicit drugs  Current medications and supplements including opioid prescriptions. Patient is not currently taking opioid prescriptions. Functional ability and status Nutritional status Physical activity Advanced directives List of other physicians Hospitalizations, surgeries, and ER visits in previous 12 months Vitals Screenings to include cognitive, depression, and falls Referrals and appointments  In addition, I have reviewed and discussed with patient certain preventive protocols, quality metrics, and best practice recommendations. A written personalized care plan for preventive services as well as general preventive health recommendations were provided to patient.     Tora Kindred, CMA   07/14/2023   After Visit Summary: (MyChart) Due to this being a telephonic visit, the after visit summary with patients personalized plan was offered to patient via MyChart   Nurse Notes:  Will get flu shot in the fall

## 2023-07-30 ENCOUNTER — Other Ambulatory Visit: Payer: Self-pay | Admitting: Nurse Practitioner

## 2023-07-30 NOTE — Telephone Encounter (Signed)
Requested by interface surescripts. Future visit in 1 month.  Requested Prescriptions  Pending Prescriptions Disp Refills   nebivolol (BYSTOLIC) 2.5 MG tablet [Pharmacy Med Name: NEBIVOLOL 2.5 MG TABLET] 90 tablet 0    Sig: TAKE 1 TABLET DAILY BY MOUTH FOR HYPERTENSION     Cardiovascular: Beta Blockers 3 Passed - 07/30/2023  1:37 AM      Passed - Cr in normal range and within 360 days    Creatinine, Ser  Date Value Ref Range Status  01/23/2023 0.63 0.57 - 1.00 mg/dL Final         Passed - AST in normal range and within 360 days    AST  Date Value Ref Range Status  01/23/2023 13 0 - 40 IU/L Final         Passed - ALT in normal range and within 360 days    ALT  Date Value Ref Range Status  01/23/2023 12 0 - 32 IU/L Final         Passed - Last BP in normal range    BP Readings from Last 1 Encounters:  01/23/23 118/84         Passed - Last Heart Rate in normal range    Pulse Readings from Last 1 Encounters:  01/23/23 67         Passed - Valid encounter within last 6 months    Recent Outpatient Visits           6 months ago Class 3 severe obesity due to excess calories without serious comorbidity with body mass index (BMI) of 40.0 to 44.9 in adult [facility])   [place] [facility] [place], Corrie Dandy T, NP   1 year ago Class 3 severe obesity due to excess calories without serious comorbidity with body mass index (BMI) of 40.0 to 44.9 in adult [facility])   [place] [facility] [place], Corrie Dandy T, NP   1 year ago Class 3 severe obesity due to excess calories without serious comorbidity with body mass index (BMI) of 40.0 to 44.9 in adult [facility])   [place] [facility] [place], Corrie Dandy T, NP   2 years ago Hypertension, essential   [place] [facility] [place], Corrie Dandy T, NP   2 years ago Encounter to establish care   [place] [facility] [place], Dorie Rank, NP       Future Appointments             In 1 month  [place], Dorie Rank, NP [place] [facility]

## 2023-08-19 ENCOUNTER — Other Ambulatory Visit: Payer: Self-pay

## 2023-08-19 DIAGNOSIS — L219 Seborrheic dermatitis, unspecified: Secondary | ICD-10-CM

## 2023-08-19 MED ORDER — KETOCONAZOLE 2 % EX SHAM: MEDICATED_SHAMPOO | CUTANEOUS | 0 refills | Status: DC

## 2023-08-19 NOTE — Progress Notes (Signed)
Refill request faxed from [facility]. Escripted

## 2023-08-30 NOTE — Patient Instructions (Signed)
Please call to schedule your mammogram and/or bone density: [facility] at [facility]  Address: [address], [place], [place] [address] Phone: [phone]  [facility] [address] [place],  [place]  [address] Phone: [phone]    Be Involved in Caring For Your Health:  Taking Medications When medications are taken as directed, they can greatly improve your health. But if they are not taken as prescribed, they may not work. In some cases, not taking them correctly can be harmful. To help ensure your treatment remains effective and safe, understand your medications and how to take them. Bring your medications to each visit for review by your provider.  Your lab results, notes, and after visit summary will be available on My Chart. We strongly encourage you to use this feature. If lab results are abnormal the clinic will contact you with the appropriate steps. If the clinic does not contact you assume the results are satisfactory. You can always view your results on My Chart. If you have questions regarding your health or results, please contact the clinic during office hours. You can also ask questions on My Chart.  We at [facility] are grateful that you chose [place] to provide your care. We strive to provide evidence-based and compassionate care and are always looking for feedback. If you get a survey from the clinic please complete this so we can hear your opinions.  DASH Eating Plan DASH stands for Dietary Approaches to Stop Hypertension. The DASH eating plan is a healthy eating plan that has been shown to: Lower high blood pressure (hypertension). Reduce your risk for type 2 diabetes, heart disease, and stroke. Help with weight loss. What are tips for following this plan? Reading food labels Check food labels for the amount of salt (sodium) per serving. Choose foods with less than 5 percent of the Daily  Value (DV) of sodium. In general, foods with less than 300 milligrams (mg) of sodium per serving fit into this eating plan. To find whole grains, look for the word "whole" as the first word in the ingredient list. Shopping Buy products labeled as "low-sodium" or "no salt added." Buy fresh foods. Avoid canned foods and pre-made or frozen meals. Cooking Try not to add salt when you cook. Use salt-free seasonings or herbs instead of table salt or sea salt. Check with your health care provider or pharmacist before using salt substitutes. Do not fry foods. Cook foods in healthy ways, such as baking, boiling, grilling, roasting, or broiling. Cook using oils that are good for your heart. These include olive, canola, avocado, soybean, and sunflower oil. Meal planning  Eat a balanced diet. This should include: 4 or more servings of fruits and 4 or more servings of vegetables each day. Try to fill half of your plate with fruits and vegetables. 6-8 servings of whole grains each day. 6 or less servings of lean meat, poultry, or fish each day. 1 oz is 1 serving. A 3 oz (85 g) serving of meat is about the same size as the palm of your hand. One egg is 1 oz (28 g). 2-3 servings of low-fat dairy each day. One serving is 1 cup (237 mL). 1 serving of nuts, seeds, or beans 5 times each week. 2-3 servings of heart-healthy fats. Healthy fats called omega-3 fatty acids are found in foods such as walnuts, flaxseeds, fortified milks, and eggs. These fats are also found in cold-water fish,  such as sardines, salmon, and mackerel. Limit how much you eat of: Canned or prepackaged foods. Food that is high in trans fat, such as fried foods. Food that is high in saturated fat, such as fatty meat. Desserts and other sweets, sugary drinks, and other foods with added sugar. Full-fat dairy products. Do not salt foods before eating. Do not eat more than 4 egg yolks a week. Try to eat at least 2 vegetarian meals a week. Eat  more home-cooked food and less restaurant, buffet, and fast food. Lifestyle When eating at a restaurant, ask if your food can be made with less salt or no salt. If you drink alcohol: Limit how much you have to: 0-1 drink a day if you are female. 0-2 drinks a day if you are female. Know how much alcohol is in your drink. In the [place] one drink is one 12 oz bottle of beer (355 mL), one 5 oz glass of wine (148 mL), or one 1 oz glass of hard liquor (44 mL). General information Avoid eating more than 2,300 mg of salt a day. If you have hypertension, you may need to reduce your sodium intake to 1,500 mg a day. Work with your provider to stay at a healthy body weight or lose weight. Ask what the best weight range is for you. On most days of the week, get at least 30 minutes of exercise that causes your heart to beat faster. This may include walking, swimming, or biking. Work with your provider or dietitian to adjust your eating plan to meet your specific calorie needs. What foods should I eat? Fruits All fresh, dried, or frozen fruit. Canned fruits that are in their natural juice and do not have sugar added to them. Vegetables Fresh or frozen vegetables that are raw, steamed, roasted, or grilled. Low-sodium or reduced-sodium tomato and vegetable juice. Low-sodium or reduced-sodium tomato sauce and tomato paste. Low-sodium or reduced-sodium canned vegetables. Grains Whole-grain or whole-wheat bread. Whole-grain or whole-wheat pasta. Brown rice. Orpah Cobb. Bulgur. Whole-grain and low-sodium cereals. Pita bread. Low-fat, low-sodium crackers. Whole-wheat flour tortillas. Meats and other proteins Skinless chicken or [place]. Ground chicken or [place]. Pork with fat trimmed off. Fish and seafood. Egg whites. Dried beans, peas, or lentils. Unsalted nuts, nut butters, and seeds. Unsalted canned beans. Lean cuts of beef with fat trimmed off. Low-sodium, lean precooked or cured meat, such as sausages or meat  loaves. Dairy Low-fat (1%) or fat-free (skim) milk. Reduced-fat, low-fat, or fat-free cheeses. Nonfat, low-sodium ricotta or cottage cheese. Low-fat or nonfat yogurt. Low-fat, low-sodium cheese. Fats and oils Soft margarine without trans fats. Vegetable oil. Reduced-fat, low-fat, or light mayonnaise and salad dressings (reduced-sodium). Canola, safflower, olive, avocado, soybean, and sunflower oils. Avocado. Seasonings and condiments Herbs. Spices. Seasoning mixes without salt. Other foods Unsalted popcorn and pretzels. Fat-free sweets. The items listed above may not be all the foods and drinks you can have. Talk to a dietitian to learn more. What foods should I avoid? Fruits Canned fruit in a light or heavy syrup. Fried fruit. Fruit in cream or butter sauce. Vegetables Creamed or fried vegetables. Vegetables in a cheese sauce. Regular canned vegetables that are not marked as low-sodium or reduced-sodium. Regular canned tomato sauce and paste that are not marked as low-sodium or reduced-sodium. Regular tomato and vegetable juices that are not marked as low-sodium or reduced-sodium. Rosita Fire. Olives. Grains Baked goods made with fat, such as croissants, muffins, or some breads. Dry pasta or rice meal packs. Meats and  other proteins Fatty cuts of meat. Ribs. Fried meat. Tomasa Blase. Bologna, salami, and other precooked or cured meats, such as sausages or meat loaves, that are not lean and low in sodium. Fat from the back of a pig (fatback). Bratwurst. Salted nuts and seeds. Canned beans with added salt. Canned or smoked fish. Whole eggs or egg yolks. Chicken or [place] with skin. Dairy Whole or 2% milk, cream, and half-and-half. Whole or full-fat cream cheese. Whole-fat or sweetened yogurt. Full-fat cheese. Nondairy creamers. Whipped toppings. Processed cheese and cheese spreads. Fats and oils Butter. Stick margarine. Lard. Shortening. Ghee. Bacon fat. Tropical oils, such as coconut, palm kernel, or palm  oil. Seasonings and condiments Onion salt, garlic salt, seasoned salt, table salt, and sea salt. Worcestershire sauce. Tartar sauce. Barbecue sauce. Teriyaki sauce. Soy sauce, including reduced-sodium soy sauce. Steak sauce. Canned and packaged gravies. Fish sauce. Oyster sauce. Cocktail sauce. Store-bought horseradish. Ketchup. Mustard. Meat flavorings and tenderizers. Bouillon cubes. Hot sauces. Pre-made or packaged marinades. Pre-made or packaged taco seasonings. Relishes. Regular salad dressings. Other foods Salted popcorn and pretzels. The items listed above may not be all the foods and drinks you should avoid. Talk to a dietitian to learn more. Where to find more information National Heart, Lung, and Blood Institute (NHLBI): BuffaloDryCleaner.gl American Heart Association (AHA): heart.org Academy of Nutrition and Dietetics: eatright.org National Kidney Foundation (NKF): kidney.org This information is not intended to replace advice given to you by your health care provider. Make sure you discuss any questions you have with your health care provider. Document Revised: 12/04/2022 Document Reviewed: 12/04/2022 Elsevier Patient Education  2024 [employer].

## 2023-09-01 ENCOUNTER — Ambulatory Visit (INDEPENDENT_AMBULATORY_CARE_PROVIDER_SITE_OTHER): Payer: Medicare Other | Admitting: Nurse Practitioner

## 2023-09-01 ENCOUNTER — Encounter: Payer: Self-pay | Admitting: Nurse Practitioner

## 2023-09-01 VITALS — BP 126/74 | HR 66 | Temp 97.4°F | Ht 63.39 in | Wt 261.2 lb

## 2023-09-01 DIAGNOSIS — E66813 Obesity, class 3: Secondary | ICD-10-CM | POA: Diagnosis not present

## 2023-09-01 DIAGNOSIS — K449 Diaphragmatic hernia without obstruction or gangrene: Secondary | ICD-10-CM | POA: Diagnosis not present

## 2023-09-01 DIAGNOSIS — E782 Mixed hyperlipidemia: Secondary | ICD-10-CM

## 2023-09-01 DIAGNOSIS — I10 Essential (primary) hypertension: Secondary | ICD-10-CM | POA: Diagnosis not present

## 2023-09-01 DIAGNOSIS — Z8542 Personal history of malignant neoplasm of other parts of uterus: Secondary | ICD-10-CM

## 2023-09-01 DIAGNOSIS — Z0000 Encounter for general adult medical examination without abnormal findings: Secondary | ICD-10-CM

## 2023-09-01 DIAGNOSIS — Z803 Family history of malignant neoplasm of breast: Secondary | ICD-10-CM

## 2023-09-01 DIAGNOSIS — K219 Gastro-esophageal reflux disease without esophagitis: Secondary | ICD-10-CM | POA: Diagnosis not present

## 2023-09-01 DIAGNOSIS — Z1211 Encounter for screening for malignant neoplasm of colon: Secondary | ICD-10-CM

## 2023-09-01 DIAGNOSIS — Z6841 Body Mass Index (BMI) 40.0 and over, adult: Secondary | ICD-10-CM

## 2023-09-01 DIAGNOSIS — Z1231 Encounter for screening mammogram for malignant neoplasm of breast: Secondary | ICD-10-CM

## 2023-09-01 DIAGNOSIS — R7301 Impaired fasting glucose: Secondary | ICD-10-CM | POA: Diagnosis not present

## 2023-09-01 DIAGNOSIS — Z1509 Genetic susceptibility to other malignant neoplasm: Secondary | ICD-10-CM

## 2023-09-01 LAB — URINALYSIS, ROUTINE W REFLEX MICROSCOPIC
Bilirubin, UA: NEGATIVE
Glucose, UA: NEGATIVE
Ketones, UA: NEGATIVE
Nitrite, UA: NEGATIVE
Protein,UA: NEGATIVE
RBC, UA: NEGATIVE
Specific Gravity, UA: 1.015 (ref 1.005–1.030)
Urobilinogen, Ur: 0.2 mg/dL (ref 0.2–1.0)
pH, UA: 8 — ABNORMAL HIGH (ref 5.0–7.5)

## 2023-09-01 LAB — MICROSCOPIC EXAMINATION: Bacteria, UA: NONE SEEN

## 2023-09-01 MED ORDER — NEBIVOLOL HCL 2.5 MG PO TABS: ORAL_TABLET | ORAL | 4 refills | Status: DC

## 2023-09-01 MED ORDER — LOSARTAN POTASSIUM-HCTZ 100-25 MG PO TABS: 1.0000 | ORAL_TABLET | Freq: Every day | ORAL | 4 refills | Status: DC

## 2023-09-01 NOTE — Assessment & Plan Note (Signed)
Mammogram ordered and recommend she schedule for annual check.

## 2023-09-01 NOTE — Assessment & Plan Note (Signed)
History of in 2012, complete hysterectomy performed.  Continue to monitor as has lymphocele reported post cancer treatment. 

## 2023-09-01 NOTE — Progress Notes (Signed)
BP 126/74   Pulse 66   Temp (!) 97.4 F (36.3 C) (Oral)   Ht 5' 3.39" (1.61 m)   Wt 261 lb 3.2 oz (118.5 kg)   SpO2 95%   BMI 45.71 kg/m    Subjective:    Patient ID: Pamela Reid, female    DOB: Nov 18, 1956, 67 y.o.   MRN: [id number]  HPI: Pamela Reid is a 67 y.o. female presenting on 09/01/2023 for comprehensive medical examination. Current medical complaints include:none  She currently lives with: husband Menopausal Symptoms: no  Continues on Nexium for heart burn.  Has PMS2-related Lynch Syndrome.  HYPERTENSION without Chronic Kidney Disease Hypertension status: stable  Satisfied with current treatment? yes Duration of hypertension: chronic BP monitoring frequency:  daily BP range: 120-130/60-70 BP medication side effects:  no Medication compliance: good compliance Aspirin: no Recurrent headaches: no Visual changes: no Palpitations: no Dyspnea: no Chest pain: no Lower extremity edema: no Dizzy/lightheaded: no   HYPERLIPIDEMIA Started Rosuvastatin last visit without ADR. Hyperlipidemia status: good compliance Satisfied with current treatment?  yes Side effects:  no Medication compliance: good compliance Supplements: none Aspirin:  no The 10-year ASCVD risk score (Arnett DK, et al., 2019) is: 9.6%   Values used to calculate the score:     Age: 76 years     Sex: Female     Is Non-Hispanic African American: No     Diabetic: No     Tobacco smoker: No     Systolic Blood Pressure: 126 mmHg     Is BP treated: Yes     HDL Cholesterol: 71 mg/dL     Total Cholesterol: 221 mg/dL Chest pain:  no Coronary artery disease:  no Family history CAD:  yes Family history early CAD:  no   Depression Screen done today and results listed below:     09/01/2023    8:25 AM 07/14/2023    3:19 PM 01/23/2023    8:18 AM 07/23/2022    8:11 AM 07/02/2022    8:37 AM  Depression screen PHQ 2/9  Decreased Interest 0 0 0 0 0  Down, Depressed, Hopeless 0 0 0 0 0  PHQ - 2 Score 0 0  0 0 0  Altered sleeping 0 0 0 0   Tired, decreased energy 0 0 0 1   Change in appetite 0 0 1 1   Feeling bad or failure about yourself  0 0 0 0   Trouble concentrating 0 0 0 1   Moving slowly or fidgety/restless 0 0 0 0   Suicidal thoughts 0 0 0 0   PHQ-9 Score 0 0 1 3   Difficult doing work/chores Not difficult at all Not difficult at all         09/01/2023    8:26 AM 01/23/2023    8:19 AM 07/23/2022    8:11 AM 01/22/2022    8:30 AM  GAD 7 : Generalized Anxiety Score  Nervous, Anxious, on Edge 0 0 1 0  Control/stop worrying 0 0 1 0  Worry too much - different things 0 0 1 0  Trouble relaxing 0 0 1 0  Restless 0 0 1 0  Easily annoyed or irritable 0 0 1 1  Afraid - awful might happen 0 0 1 0  Total GAD 7 Score 0 0 7 1  Anxiety Difficulty   Not difficult at all Not difficult at all      01/22/2022    8:29 AM 07/23/2022  8:11 AM 01/23/2023    8:18 AM 07/10/2023   12:17 PM 09/01/2023    8:25 AM  Fall Risk  Falls in the past year? 0 0 0 0 0  Was there an injury with Fall? 0 0 0 0 0  Fall Risk Category Calculator 0 0 0 0 0  Fall Risk Category (Retired) Low Low     (RETIRED) Patient Fall Risk Level Low fall risk Low fall risk     Patient at Risk for Falls Due to No Fall Risks No Fall Risks No Fall Risks No Fall Risks No Fall Risks  Fall risk Follow up Falls evaluation completed Falls evaluation completed Falls evaluation completed Falls prevention discussed Falls evaluation completed      Functional Status Survey: Is the patient deaf or have difficulty hearing?: No Does the patient have difficulty seeing, even when wearing glasses/contacts?: No Does the patient have difficulty concentrating, remembering, or making decisions?: No Does the patient have difficulty walking or climbing stairs?: No Does the patient have difficulty dressing or bathing?: No Does the patient have difficulty doing errands alone such as visiting a doctor's office or shopping?: No   Past Medical History:   Past Medical History:  Diagnosis Date   Allergy    Cancer (HCC)    Family history of breast cancer    Family history of melanoma    GERD (gastroesophageal reflux disease)    Heart murmur as a child - mitral valve   Hypertension    Lynch syndrome    Thyroid disease Thyroid biopsy for tumor 2013    Surgical History:  Past Surgical History:  Procedure Laterality Date   ABDOMINAL HYSTERECTOMY     BREAST BIOPSY Left 2019   stereotatic bx neg   CHOLECYSTECTOMY     COLON SURGERY  2017   COLONOSCOPY WITH PROPOFOL N/A 09/11/2021   Procedure: COLONOSCOPY WITH PROPOFOL;  Surgeon: Toney Reil, MD;  Location: [facility];  Service: Gastroenterology;  Laterality: N/A;   TUBAL LIGATION  1986    Medications:  Current Outpatient Medications on File Prior to Visit  Medication Sig   Aspirin 81 MG CAPS Take by mouth.   Calcium Polycarbophil (FIBER) 625 MG TABS Take by mouth.   Cholecalciferol 75 MCG (3000 UT) TABS Take by mouth.   co-enzyme Q-10 30 MG capsule Take by mouth.   esomeprazole (NEXIUM) 20 MG capsule Take by mouth.   ibuprofen (ADVIL) 200 MG tablet Take by mouth.   ketoconazole (NIZORAL) 2 % shampoo Shampoo into the scalp let sit 5-10 minutes then wash off. Use 3d/wk.   mometasone (ELOCON) 0.1 % lotion Apply topically daily. PRN up to 5 days per week   Multiple Vitamin (MULTI-VITAMINS) TABS Take 1 tablet by mouth daily.   rosuvastatin (CRESTOR) 10 MG tablet Take 1 tablet (10 mg total) by mouth daily.   No current facility-administered medications on file prior to visit.    Allergies:  Allergies  Allergen Reactions   Cephalexin Hives and Rash    dermatitis dermatitis    Penicillins Hives and Rash    dermatitis dermatitis     Social History:  Social History   Socioeconomic History   Marital status: Married    Spouse name: Loraine Leriche   Number of children: 2   Years of education: Not on file   Highest education level: Not on file  Occupational History    Occupation: part time consulting & teaching  Tobacco Use   Smoking status: Never   Smokeless  tobacco: Never  Vaping Use   Vaping status: Never Used  Substance and Sexual Activity   Alcohol use: Yes    Alcohol/week: 1.0 standard drink of alcohol    Types: 1 Glasses of wine per week    Comment: occasional   Drug use: Never   Sexual activity: Yes    Birth control/protection: Post-menopausal  Other Topics Concern   Not on file  Social History Narrative   Married, 2 children and 7 grandchildren   Social Determinants of Health   Financial Resource Strain: Low Risk  (07/10/2023)   Overall Financial Resource Strain (CARDIA)    Difficulty of Paying Living Expenses: Not hard at all  Food Insecurity: No Food Insecurity (07/10/2023)   Hunger Vital Sign    Worried About Running Out of Food in the Last Year: Never true    Ran Out of Food in the Last Year: Never true  Transportation Needs: No Transportation Needs (07/10/2023)   PRAPARE - Administrator, Civil Service (Medical): No    Lack of Transportation (Non-Medical): No  Physical Activity: Sufficiently Active (07/10/2023)   Exercise Vital Sign    Days of Exercise per Week: 4 days    Minutes of Exercise per Session: 60 min  Stress: No Stress Concern Present (07/10/2023)   [employer] of Occupational Health - Occupational Stress Questionnaire    Feeling of Stress : Not at all  Social Connections: Moderately Integrated (07/10/2023)   Social Connection and Isolation Panel [NHANES]    Frequency of Communication with Friends and Family: More than three times a week    Frequency of Social Gatherings with Friends and Family: More than three times a week    Attends Religious Services: Never    Database administrator or Organizations: Yes    Attends Engineer, structural: More than 4 times per year    Marital Status: Married  Catering manager Violence: Not At Risk (07/14/2023)   Humiliation, Afraid, Rape, and Kick questionnaire     Fear of Current or Ex-Partner: No    Emotionally Abused: No    Physically Abused: No    Sexually Abused: No   Social History   Tobacco Use  Smoking Status Never  Smokeless Tobacco Never   Social History   Substance and Sexual Activity  Alcohol Use Yes   Alcohol/week: 1.0 standard drink of alcohol   Types: 1 Glasses of wine per week   Comment: occasional    Family History:  Family History  Problem Relation Age of Onset   Hypertension Mother    Melanoma Mother    Asthma Mother    Hyperlipidemia Mother    Parkinson's disease Father    Dementia Father    Hyperlipidemia Father    Hypertension Father    Healthy Sister    Healthy Brother    Breast cancer Maternal Aunt        dx 25s   Melanoma Paternal Aunt    Cancer Maternal Grandmother        "GI cancer" dx 39s   Cancer Maternal Grandfather        "GI cancer" dx 24s   Breast cancer Paternal Grandmother        dx 44s   Cancer Paternal Grandmother    Parkinson's disease Paternal Grandfather    Healthy Daughter    Healthy Son    Cancer Maternal Aunt     Past medical history, surgical history, medications, allergies, family history and social history reviewed  with patient today and changes made to appropriate areas of the chart.   ROS All other ROS negative except what is listed above and in the HPI.      Objective:    BP 126/74   Pulse 66   Temp (!) 97.4 F (36.3 C) (Oral)   Ht 5' 3.39" (1.61 m)   Wt 261 lb 3.2 oz (118.5 kg)   SpO2 95%   BMI 45.71 kg/m   Wt Readings from Last 3 Encounters:  09/01/23 261 lb 3.2 oz (118.5 kg)  07/14/23 251 lb (113.9 kg)  01/23/23 258 lb 9.6 oz (117.3 kg)    Physical Exam Vitals and nursing note reviewed.  Constitutional:      General: She is awake. She is not in acute distress.    Appearance: She is well-developed. She is not ill-appearing.  HENT:     Head: Normocephalic and atraumatic.     Right Ear: Hearing, tympanic membrane, ear canal and external ear normal.  No drainage.     Left Ear: Hearing, tympanic membrane, ear canal and external ear normal. No drainage.     Nose: Nose normal.     Right Sinus: No maxillary sinus tenderness or frontal sinus tenderness.     Left Sinus: No maxillary sinus tenderness or frontal sinus tenderness.     Mouth/Throat:     Mouth: Mucous membranes are moist.     Pharynx: Oropharynx is clear. Uvula midline. No pharyngeal swelling, oropharyngeal exudate or posterior oropharyngeal erythema.  Eyes:     General: Lids are normal.        Right eye: No discharge.        Left eye: No discharge.     Extraocular Movements: Extraocular movements intact.     Conjunctiva/sclera: Conjunctivae normal.     Pupils: Pupils are equal, round, and reactive to light.     Visual Fields: Right eye visual fields normal and left eye visual fields normal.  Neck:     Thyroid: No thyromegaly.     Vascular: No carotid bruit.     Trachea: Trachea normal.  Cardiovascular:     Rate and Rhythm: Normal rate and regular rhythm.     Heart sounds: Normal heart sounds. No murmur heard.    No gallop.  Pulmonary:     Effort: Pulmonary effort is normal. No accessory muscle usage or respiratory distress.     Breath sounds: Normal breath sounds.  Chest:     Comments: Deferred per patient request, does home self exams and denies any concerns. Abdominal:     General: Bowel sounds are normal.     Palpations: Abdomen is soft. There is no hepatomegaly or splenomegaly.     Tenderness: There is no abdominal tenderness.  Musculoskeletal:        General: Normal range of motion.     Cervical back: Normal range of motion and neck supple.     Right lower leg: No edema.     Left lower leg: No edema.  Lymphadenopathy:     Head:     Right side of head: No submental, submandibular, tonsillar, preauricular or posterior auricular adenopathy.     Left side of head: No submental, submandibular, tonsillar, preauricular or posterior auricular adenopathy.      Cervical: No cervical adenopathy.  Skin:    General: Skin is warm and dry.     Capillary Refill: Capillary refill takes less than 2 seconds.     Findings: No rash.  Neurological:  Mental Status: She is alert and oriented to person, place, and time.     Gait: Gait is intact.     Deep Tendon Reflexes: Reflexes are normal and symmetric.     Reflex Scores:      Brachioradialis reflexes are 2+ on the right side and 2+ on the left side.      Patellar reflexes are 2+ on the right side and 2+ on the left side. Psychiatric:        Attention and Perception: Attention normal.        Mood and Affect: Mood normal.        Speech: Speech normal.        Behavior: Behavior normal. Behavior is cooperative.        Thought Content: Thought content normal.        Judgment: Judgment normal.    Results for orders placed or performed in visit on 01/23/23  Comprehensive metabolic panel  Result Value Ref Range   Glucose 107 (H) 70 - 99 mg/dL   BUN 9 8 - 27 mg/dL   Creatinine, Ser 4.16 0.57 - 1.00 mg/dL   eGFR 97 >60 YT/KZS/0.10   BUN/Creatinine Ratio 14 12 - 28   Sodium 142 134 - 144 mmol/L   Potassium 3.9 3.5 - 5.2 mmol/L   Chloride 100 96 - 106 mmol/L   CO2 24 20 - 29 mmol/L   Calcium 9.5 8.7 - 10.3 mg/dL   Total Protein 6.7 6.0 - 8.5 g/dL   Albumin 4.4 3.9 - 4.9 g/dL   Globulin, Total 2.3 1.5 - 4.5 g/dL   Albumin/Globulin Ratio 1.9 1.2 - 2.2   Bilirubin Total 0.6 0.0 - 1.2 mg/dL   Alkaline Phosphatase 91 44 - 121 IU/L   AST 13 0 - 40 IU/L   ALT 12 0 - 32 IU/L  Lipid Panel w/o Chol/HDL Ratio  Result Value Ref Range   Cholesterol, Total 221 (H) 100 - 199 mg/dL   Triglycerides 932 0 - 149 mg/dL   HDL 71 >35 mg/dL   VLDL Cholesterol Cal 22 5 - 40 mg/dL   LDL Chol Calc (NIH) 573 (H) 0 - 99 mg/dL      Assessment & Plan:   Problem List Items Addressed This Visit       Cardiovascular and Mediastinum   Hypertension, essential    Chronic, stable.  BP at goal in office today and on home  checks.  Recommend she monitor BP at least a few mornings a week at home and document.  DASH diet at home.  Continue current medication regimen and adjust as needed.  Refills sent in.  Labs: CMP, CBC, TSH, UA. Return in 6 months.      Relevant Medications   nebivolol (BYSTOLIC) 2.5 MG tablet   losartan-hydrochlorothiazide (HYZAAR) 100-25 MG tablet   Other Relevant Orders   CBC with Differential/Platelet   Comprehensive metabolic panel   TSH     Respiratory   Hiatal hernia    Continues on Nexium, refer to GERD plan of care.        Digestive   GERD (gastroesophageal reflux disease)    Ongoing with use of Nexium.  Continue to monitor and adjust regimen as needed.  Mag level annually.      Relevant Orders   Magnesium     Other   Family history of breast cancer    Mammogram ordered and recommend she schedule for annual check.      Relevant Orders  MM 3D SCREENING MAMMOGRAM BILATERAL BREAST   History of endometrial cancer    History of in 2012, complete hysterectomy performed.  Continue to monitor as has lymphocele reported post cancer treatment.      Mixed hyperlipidemia    Chronic, ongoing.  She is tolerating Rosuvastatin.  Recommend she continue this and we will adjust as needed.  Lipid panel and CMP today.      Relevant Medications   nebivolol (BYSTOLIC) 2.5 MG tablet   losartan-hydrochlorothiazide (HYZAAR) 100-25 MG tablet   Other Relevant Orders   Comprehensive metabolic panel   Lipid Panel w/o Chol/HDL Ratio   Obesity - Primary    BMI 45.71.  Recommended eating smaller high protein, low fat meals more frequently and exercising 30 mins a day 5 times a week with a goal of 10-15lb weight loss in the next 3 months. Patient voiced their understanding and motivation to adhere to these recommendations.       PMS2-related Lynch syndrome (HNPCC4)    Chronic.  Genetic testing on 10/16/21 performed.  Continue to follow with GI for scheduled EGD and colonoscopies.  Amylase  and Lipase + CMP at annual physical.      Relevant Orders   Comprehensive metabolic panel   Amylase   Lipase   Urinalysis, Routine w reflex microscopic   Other Visit Diagnoses     IFG (impaired fasting glucose)       Check A1c today and annually.   Relevant Orders   HgB A1c   Encounter for screening mammogram for malignant neoplasm of breast       Mammogram ordered today and instructed on how to schedule.   Encounter for annual physical exam       Annual physical today with labs and health maintenance reviewed, discussed with patient.        Follow up plan: Return in about 6 months (around 03/01/2024) for HTN/HLD.   LABORATORY TESTING:  - Pap smear: not applicable  IMMUNIZATIONS:   - Tdap: Tetanus vaccination status reviewed: last tetanus booster within 10 years. - Influenza: Up to date - Pneumovax: Up To Date - Prevnar: Up To Date - HPV: Not applicable - Zostavax vaccine: Up to date  SCREENING: -Mammogram: Up To Date - January 2023 - Colonoscopy: Up To Date - Bone Density: Up To Date -- January 2023 -Hearing Test: Not applicable  -Spirometry: Not applicable   PATIENT COUNSELING:   Advised to take 1 mg of folate supplement per day if capable of pregnancy.   Sexuality: Discussed sexually transmitted diseases, partner selection, use of condoms, avoidance of unintended pregnancy  and contraceptive alternatives.   Advised to avoid cigarette smoking.  I discussed with the patient that most people either abstain from alcohol or drink within safe limits (<=14/week and <=4 drinks/occasion for males, <=7/weeks and <= 3 drinks/occasion for females) and that the risk for alcohol disorders and other health effects rises proportionally with the number of drinks per week and how often a drinker exceeds daily limits.  Discussed cessation/primary prevention of drug use and availability of treatment for abuse.   Diet: Encouraged to adjust caloric intake to maintain  or achieve  ideal body weight, to reduce intake of dietary saturated fat and total fat, to limit sodium intake by avoiding high sodium foods and not adding table salt, and to maintain adequate dietary potassium and calcium preferably from fresh fruits, vegetables, and low-fat dairy products.    Stressed the importance of regular exercise  Injury prevention: Discussed safety  belts, safety helmets, smoke detector, smoking near bedding or upholstery.   Dental health: Discussed importance of regular tooth brushing, flossing, and dental visits.    NEXT PREVENTATIVE PHYSICAL DUE IN 1 YEAR. Return in about 6 months (around 03/01/2024) for HTN/HLD.

## 2023-09-01 NOTE — Assessment & Plan Note (Signed)
Chronic.  Genetic testing on 10/16/21 performed.  Continue to follow with GI for scheduled EGD and colonoscopies.  Amylase and Lipase + CMP at annual physical.

## 2023-09-01 NOTE — Assessment & Plan Note (Signed)
BMI 45.71.  Recommended eating smaller high protein, low fat meals more frequently and exercising 30 mins a day 5 times a week with a goal of 10-15lb weight loss in the next 3 months. Patient voiced their understanding and motivation to adhere to these recommendations.

## 2023-09-01 NOTE — Assessment & Plan Note (Signed)
Chronic, ongoing.  She is tolerating Rosuvastatin.  Recommend she continue this and we will adjust as needed.  Lipid panel and CMP today.

## 2023-09-01 NOTE — Assessment & Plan Note (Signed)
Continues on Nexium, refer to GERD plan of care.

## 2023-09-01 NOTE — Assessment & Plan Note (Signed)
Chronic, stable.  BP at goal in office today and on home checks.  Recommend she monitor BP at least a few mornings a week at home and document.  DASH diet at home.  Continue current medication regimen and adjust as needed.  Refills sent in.  Labs: CMP, CBC, TSH, UA. Return in 6 months.

## 2023-09-01 NOTE — Assessment & Plan Note (Signed)
Ongoing with use of Nexium.  Continue to monitor and adjust regimen as needed.  Mag level annually.

## 2023-09-02 ENCOUNTER — Encounter: Payer: Self-pay | Admitting: Nurse Practitioner

## 2023-09-02 LAB — CBC WITH DIFFERENTIAL/PLATELET
Basophils Absolute: 0 10*3/uL (ref 0.0–0.2)
Basos: 0 %
EOS (ABSOLUTE): 0.1 10*3/uL (ref 0.0–0.4)
Eos: 2 %
Hematocrit: 42.7 % (ref 34.0–46.6)
Hemoglobin: 13.7 g/dL (ref 11.1–15.9)
Immature Grans (Abs): 0 10*3/uL (ref 0.0–0.1)
Immature Granulocytes: 0 %
Lymphocytes Absolute: 1.1 10*3/uL (ref 0.7–3.1)
Lymphs: 16 %
MCH: 29 pg (ref 26.6–33.0)
MCHC: 32.1 g/dL (ref 31.5–35.7)
MCV: 91 fL (ref 79–97)
Monocytes Absolute: 0.5 10*3/uL (ref 0.1–0.9)
Monocytes: 8 %
Neutrophils Absolute: 5 10*3/uL (ref 1.4–7.0)
Neutrophils: 74 %
Platelets: 274 10*3/uL (ref 150–450)
RBC: 4.72 x10E6/uL (ref 3.77–5.28)
RDW: 12.5 % (ref 11.7–15.4)
WBC: 6.8 10*3/uL (ref 3.4–10.8)

## 2023-09-02 LAB — COMPREHENSIVE METABOLIC PANEL
ALT: 27 [IU]/L (ref 0–32)
AST: 19 [IU]/L (ref 0–40)
Albumin: 4.6 g/dL (ref 3.9–4.9)
Alkaline Phosphatase: 143 [IU]/L — ABNORMAL HIGH (ref 44–121)
BUN/Creatinine Ratio: 17 (ref 12–28)
BUN: 11 mg/dL (ref 8–27)
Bilirubin Total: 0.6 mg/dL (ref 0.0–1.2)
CO2: 24 mmol/L (ref 20–29)
Calcium: 9.8 mg/dL (ref 8.7–10.3)
Chloride: 97 mmol/L (ref 96–106)
Creatinine, Ser: 0.63 mg/dL (ref 0.57–1.00)
Globulin, Total: 2.6 g/dL (ref 1.5–4.5)
Glucose: 108 mg/dL — ABNORMAL HIGH (ref 70–99)
Potassium: 3.6 mmol/L (ref 3.5–5.2)
Sodium: 140 mmol/L (ref 134–144)
Total Protein: 7.2 g/dL (ref 6.0–8.5)
eGFR: 97 mL/min/{1.73_m2} (ref >59)

## 2023-09-02 LAB — AMYLASE: Amylase: 54 U/L (ref 31–110)

## 2023-09-02 LAB — LIPASE: Lipase: 22 U/L (ref 14–72)

## 2023-09-02 LAB — LIPID PANEL W/O CHOL/HDL RATIO
Cholesterol, Total: 184 mg/dL (ref 100–199)
HDL: 75 mg/dL (ref >39)
LDL Chol Calc (NIH): 82 mg/dL (ref 0–99)
Triglycerides: 164 mg/dL — ABNORMAL HIGH (ref 0–149)
VLDL Cholesterol Cal: 27 mg/dL (ref 5–40)

## 2023-09-02 LAB — HEMOGLOBIN A1C
Est. average glucose Bld gHb Est-mCnc: 114 mg/dL
Hgb A1c MFr Bld: 5.6 % (ref 4.8–5.6)

## 2023-09-02 LAB — MAGNESIUM: Magnesium: 2 mg/dL (ref 1.6–2.3)

## 2023-09-02 LAB — TSH: TSH: 3.46 u[IU]/mL (ref 0.450–4.500)

## 2023-09-02 MED ORDER — ROSUVASTATIN CALCIUM 20 MG PO TABS: 20.0000 mg | ORAL_TABLET | Freq: Every day | ORAL | 4 refills | Status: DC

## 2023-09-02 NOTE — Progress Notes (Signed)
Contacted via MyChart   Good morning Pamela Reid, your labs have returned and overall are stable: - Kidney function, creatinine and eGFR, remains normal, as is liver function, AST and ALT.  - Alkaline phosphatase a little elevated, which has been in past.  We will continue to monitor this. - Lipid panel is showing improvement!!  LDL from 128 to now 82!! I would like to see it a little lower then this and recommend we go up on Rosuvastatin to 20 MG.  Are you okay with this?  Let me know and I can send in. - Remainder of labs are great!!  Any questions? Keep being stellar!!  Thank you for allowing me to participate in your care.  I appreciate you. Kindest regards, Marvia Troost

## 2023-09-14 ENCOUNTER — Other Ambulatory Visit: Payer: Self-pay | Admitting: Dermatology

## 2023-09-14 DIAGNOSIS — L219 Seborrheic dermatitis, unspecified: Secondary | ICD-10-CM

## 2023-09-16 ENCOUNTER — Other Ambulatory Visit: Payer: Self-pay | Admitting: Dermatology

## 2023-09-16 DIAGNOSIS — L219 Seborrheic dermatitis, unspecified: Secondary | ICD-10-CM

## 2023-09-17 ENCOUNTER — Encounter: Payer: Self-pay | Admitting: Nurse Practitioner

## 2023-09-17 DIAGNOSIS — L219 Seborrheic dermatitis, unspecified: Secondary | ICD-10-CM

## 2023-09-17 MED ORDER — MOMETASONE FUROATE 0.1 % EX SOLN
Freq: Every day | CUTANEOUS | 1 refills | Status: DC
Start: 2023-09-17 — End: 2024-01-11

## 2023-09-17 MED ORDER — KETOCONAZOLE 2 % EX SHAM
MEDICATED_SHAMPOO | CUTANEOUS | 2 refills | Status: DC
Start: 2023-09-17 — End: 2024-01-11

## 2024-01-02 ENCOUNTER — Other Ambulatory Visit: Payer: Self-pay | Admitting: Nurse Practitioner

## 2024-01-04 NOTE — Telephone Encounter (Signed)
Sending refill to [facility].  Requested Prescriptions  Pending Prescriptions Disp Refills   nebivolol (BYSTOLIC) 2.5 MG tablet [Pharmacy Med Name: NEBIVOLOL 2.5 MG TABLET] 90 tablet 0    Sig: TAKE 1 TABLET DAILY BY MOUTH FOR HYPERTENSION     Cardiovascular: Beta Blockers 3 Passed - 01/04/2024  1:46 PM      Passed - Cr in normal range and within 360 days    Creatinine, Ser  Date Value Ref Range Status  09/01/2023 0.63 0.57 - 1.00 mg/dL Final         Passed - AST in normal range and within 360 days    AST  Date Value Ref Range Status  09/01/2023 19 0 - 40 IU/L Final         Passed - ALT in normal range and within 360 days    ALT  Date Value Ref Range Status  09/01/2023 27 0 - 32 IU/L Final         Passed - Last BP in normal range    BP Readings from Last 1 Encounters:  09/01/23 126/74         Passed - Last Heart Rate in normal range    Pulse Readings from Last 1 Encounters:  09/01/23 66         Passed - Valid encounter within last 6 months    Recent Outpatient Visits           4 months ago Class 3 severe obesity due to excess calories without serious comorbidity with body mass index (BMI) of 45.0 to 49.9 in adult [facility])   [place] [facility] [place], Jolene T, NP   11 months ago Class 3 severe obesity due to excess calories without serious comorbidity with body mass index (BMI) of 40.0 to 44.9 in adult [facility])   [place] [facility] [place], Corrie Dandy T, NP   1 year ago Class 3 severe obesity due to excess calories without serious comorbidity with body mass index (BMI) of 40.0 to 44.9 in adult [facility])   [place] [facility] [place], Corrie Dandy T, NP   1 year ago Class 3 severe obesity due to excess calories without serious comorbidity with body mass index (BMI) of 40.0 to 44.9 in adult [facility])   [place] [facility] [place], Corrie Dandy T, NP   2 years ago Hypertension, essential   [place] [facility] [place],  Dorie Rank, NP       Future Appointments             In 1 month [place], Dorie Rank, NP  [facility] [place]

## 2024-01-11 ENCOUNTER — Encounter: Payer: Self-pay | Admitting: Nurse Practitioner

## 2024-01-11 ENCOUNTER — Other Ambulatory Visit: Payer: Self-pay | Admitting: Dermatology

## 2024-01-11 DIAGNOSIS — L219 Seborrheic dermatitis, unspecified: Secondary | ICD-10-CM

## 2024-01-12 MED ORDER — MOMETASONE FUROATE 0.1 % EX SOLN: CUTANEOUS | 2 refills | Status: AC

## 2024-01-12 MED ORDER — KETOCONAZOLE 2 % EX SHAM: MEDICATED_SHAMPOO | CUTANEOUS | 2 refills | Status: AC

## 2024-01-12 MED ORDER — MOMETASONE FUROATE 0.1 % EX SOLN: CUTANEOUS | 2 refills | Status: DC

## 2024-01-12 MED ORDER — KETOCONAZOLE 2 % EX SHAM: MEDICATED_SHAMPOO | CUTANEOUS | 2 refills | Status: DC

## 2024-01-12 NOTE — Addendum Note (Signed)
Addended by: Aura Dials T on: 01/12/2024 08:15 AM   Modules accepted: Orders

## 2024-02-11 ENCOUNTER — Encounter: Payer: Self-pay | Admitting: Nurse Practitioner

## 2024-02-12 ENCOUNTER — Encounter: Payer: Self-pay | Admitting: Nurse Practitioner

## 2024-02-14 NOTE — Patient Instructions (Signed)
 Be Involved in Caring For Your Health:  Taking Medications When medications are taken as directed, they can greatly improve your health. But if they are not taken as prescribed, they may not work. In some cases, not taking them correctly can be harmful. To help ensure your treatment remains effective and safe, understand your medications and how to take them. Bring your medications to each visit for review by your provider.  Your lab results, notes, and after visit summary will be available on My Chart. We strongly encourage you to use this feature. If lab results are abnormal the clinic will contact you with the appropriate steps. If the clinic does not contact you assume the results are satisfactory. You can always view your results on My Chart. If you have questions regarding your health or results, please contact the clinic during office hours. You can also ask questions on My Chart.  We at [facility] are grateful that you chose [place] to provide your care. We strive to provide evidence-based and compassionate care and are always looking for feedback. If you get a survey from the clinic please complete this so we can hear your opinions.  Heart-Healthy Eating Plan Many factors influence your heart health, including eating and exercise habits. Heart health is also called coronary health. Coronary risk increases with abnormal blood fat (lipid) levels. A heart-healthy eating plan includes limiting unhealthy fats, increasing healthy fats, limiting salt (sodium) intake, and making other diet and lifestyle changes. What is my plan? Your health care provider may recommend that: You limit your fat intake to _________% or less of your total calories each day. You limit your saturated fat intake to _________% or less of your total calories each day. You limit the amount of cholesterol in your diet to less than _________ mg per day. You limit the amount of sodium in your diet to less than _________  mg per day. What are tips for following this plan? Cooking Cook foods using methods other than frying. Baking, boiling, grilling, and broiling are all good options. Other ways to reduce fat include: Removing the skin from poultry. Removing all visible fats from meats. Steaming vegetables in water or broth. Meal planning  At meals, imagine dividing your plate into fourths: Fill one-half of your plate with vegetables and green salads. Fill one-fourth of your plate with whole grains. Fill one-fourth of your plate with lean protein foods. Eat 2-4 cups of vegetables per day. One cup of vegetables equals 1 cup (91 g) broccoli or cauliflower florets, 2 medium carrots, 1 large bell pepper, 1 large sweet potato, 1 large tomato, 1 medium white potato, 2 cups (150 g) raw leafy greens. Eat 1-2 cups of fruit per day. One cup of fruit equals 1 small apple, 1 large banana, 1 cup (237 g) mixed fruit, 1 large orange,  cup (82 g) dried fruit, 1 cup (240 mL) 100% fruit juice. Eat more foods that contain soluble fiber. Examples include apples, broccoli, carrots, beans, peas, and barley. Aim to get 25-30 g of fiber per day. Increase your consumption of legumes, nuts, and seeds to 4-5 servings per week. One serving of dried beans or legumes equals  cup (90 g) cooked, 1 serving of nuts is  oz (12 almonds, 24 pistachios, or 7 walnut halves), and 1 serving of seeds equals  oz (8 g). Fats Choose healthy fats more often. Choose monounsaturated and polyunsaturated fats, such as olive and canola oils, avocado oil, flaxseeds, walnuts, almonds, and seeds. Eat  more omega-3 fats. Choose salmon, mackerel, sardines, tuna, flaxseed oil, and ground flaxseeds. Aim to eat fish at least 2 times each week. Check food labels carefully to identify foods with trans fats or high amounts of saturated fat. Limit saturated fats. These are found in animal products, such as meats, butter, and cream. Plant sources of saturated fats  include palm oil, palm kernel oil, and coconut oil. Avoid foods with partially hydrogenated oils in them. These contain trans fats. Examples are stick margarine, some tub margarines, cookies, crackers, and other baked goods. Avoid fried foods. General information Eat more home-cooked food and less restaurant, buffet, and fast food. Limit or avoid alcohol. Limit foods that are high in added sugar and simple starches such as foods made using white refined flour (white breads, pastries, sweets). Lose weight if you are overweight. Losing just 5-10% of your body weight can help your overall health and prevent diseases such as diabetes and heart disease. Monitor your sodium intake, especially if you have high blood pressure. Talk with your health care provider about your sodium intake. Try to incorporate more vegetarian meals weekly. What foods should I eat? Fruits All fresh, canned (in natural juice), or frozen fruits. Vegetables Fresh or frozen vegetables (raw, steamed, roasted, or grilled). Green salads. Grains Most grains. Choose whole wheat and whole grains most of the time. Rice and pasta, including brown rice and pastas made with whole wheat. Meats and other proteins Lean, well-trimmed beef, veal, pork, and lamb. Chicken and [place] without skin. All fish and shellfish. Wild duck, rabbit, pheasant, and venison. Egg whites or low-cholesterol egg substitutes. Dried beans, peas, lentils, and tofu. Seeds and most nuts. Dairy Low-fat or nonfat cheeses, including ricotta and mozzarella. Skim or 1% milk (liquid, powdered, or evaporated). Buttermilk made with low-fat milk. Nonfat or low-fat yogurt. Fats and oils Non-hydrogenated (trans-free) margarines. Vegetable oils, including soybean, sesame, sunflower, olive, avocado, peanut, safflower, corn, canola, and cottonseed. Salad dressings or mayonnaise made with a vegetable oil. Beverages Water (mineral or sparkling). Coffee and tea. Unsweetened ice  tea. Diet beverages. Sweets and desserts Sherbet, gelatin, and fruit ice. Small amounts of dark chocolate. Limit all sweets and desserts. Seasonings and condiments All seasonings and condiments. The items listed above may not be a complete list of foods and beverages you can eat. Contact a dietitian for more options. What foods should I avoid? Fruits Canned fruit in heavy syrup. Fruit in cream or butter sauce. Fried fruit. Limit coconut. Vegetables Vegetables cooked in cheese, cream, or butter sauce. Fried vegetables. Grains Breads made with saturated or trans fats, oils, or whole milk. Croissants. Sweet rolls. Donuts. High-fat crackers, such as cheese crackers and chips. Meats and other proteins Fatty meats, such as hot dogs, ribs, sausage, bacon, rib-eye roast or steak. High-fat deli meats, such as salami and bologna. Caviar. Domestic duck and goose. Organ meats, such as liver. Dairy Cream, sour cream, cream cheese, and creamed cottage cheese. Whole-milk cheeses. Whole or 2% milk (liquid, evaporated, or condensed). Whole buttermilk. Cream sauce or high-fat cheese sauce. Whole-milk yogurt. Fats and oils Meat fat, or shortening. Cocoa butter, hydrogenated oils, palm oil, coconut oil, palm kernel oil. Solid fats and shortenings, including bacon fat, salt pork, lard, and butter. Nondairy cream substitutes. Salad dressings with cheese or sour cream. Beverages Regular sodas and any drinks with added sugar. Sweets and desserts Frosting. Pudding. Cookies. Cakes. Pies. Milk chocolate or white chocolate. Buttered syrups. Full-fat ice cream or ice cream drinks. The items listed above may  not be a complete list of foods and beverages to avoid. Contact a dietitian for more information. Summary Heart-healthy meal planning includes limiting unhealthy fats, increasing healthy fats, limiting salt (sodium) intake and making other diet and lifestyle changes. Lose weight if you are overweight. Losing just  5-10% of your body weight can help your overall health and prevent diseases such as diabetes and heart disease. Focus on eating a balance of foods, including fruits and vegetables, low-fat or nonfat dairy, lean protein, nuts and legumes, whole grains, and heart-healthy oils and fats. This information is not intended to replace advice given to you by your health care provider. Make sure you discuss any questions you have with your health care provider. Document Revised: 12/23/2021 Document Reviewed: 12/23/2021 Elsevier Patient Education  2024 [employer].

## 2024-02-19 ENCOUNTER — Encounter: Payer: Self-pay | Admitting: Nurse Practitioner

## 2024-02-19 ENCOUNTER — Ambulatory Visit (INDEPENDENT_AMBULATORY_CARE_PROVIDER_SITE_OTHER): Admitting: Nurse Practitioner

## 2024-02-19 VITALS — BP 126/70 | HR 70 | Temp 98.1°F | Ht 63.4 in | Wt 261.0 lb

## 2024-02-19 DIAGNOSIS — I10 Essential (primary) hypertension: Secondary | ICD-10-CM

## 2024-02-19 DIAGNOSIS — Z1509 Genetic susceptibility to other malignant neoplasm: Secondary | ICD-10-CM | POA: Diagnosis not present

## 2024-02-19 DIAGNOSIS — E782 Mixed hyperlipidemia: Secondary | ICD-10-CM

## 2024-02-19 DIAGNOSIS — K219 Gastro-esophageal reflux disease without esophagitis: Secondary | ICD-10-CM | POA: Diagnosis not present

## 2024-02-19 DIAGNOSIS — Z6841 Body Mass Index (BMI) 40.0 and over, adult: Secondary | ICD-10-CM

## 2024-02-19 DIAGNOSIS — E66813 Obesity, class 3: Secondary | ICD-10-CM

## 2024-02-19 NOTE — Assessment & Plan Note (Signed)
 BMI 45.65.  Recommended eating smaller high protein, low fat meals more frequently and exercising 30 mins a day 5 times a week with a goal of 10-15lb weight loss in the next 3 months. Patient voiced their understanding and motivation to adhere to these recommendations.

## 2024-02-19 NOTE — Assessment & Plan Note (Signed)
Ongoing with use of Nexium.  Continue to monitor and adjust regimen as needed.  Mag level annually.

## 2024-02-19 NOTE — Assessment & Plan Note (Signed)
Chronic, ongoing.  She is tolerating Rosuvastatin.  Recommend she continue this and we will adjust as needed.  Lipid panel and CMP today.

## 2024-02-19 NOTE — Progress Notes (Signed)
 BP 126/70 (BP Location: Left Arm, Patient Position: Sitting, Cuff Size: Large)   Pulse 70   Temp 98.1 F (36.7 C) (Oral)   Ht 5' 3.4" (1.61 m)   Wt 261 lb (118.4 kg)   SpO2 98%   BMI 45.65 kg/m    Subjective:    Patient ID: Pamela Reid, female    DOB: 09-14-1956, 68 y.o.   MRN: [id number]  HPI: Zoeann Mol is a 68 y.o. female  Chief Complaint  Patient presents with   Hyperlipidemia   Hypertension   HYPERTENSION Taking Losartan-HCTZ 100-25 MG and Bystolic 2.5 MG daily. Lynch syndrome, genetic counseling 10/16/21 -- has EGD and colonoscopy every 2 years for this.   Hypertension status: stable  Satisfied with current treatment? yes Duration of hypertension: chronic BP monitoring frequency: 3 times a week BP range:  116-128/80 range at home BP medication side effects:  no Medication compliance: good compliance Aspirin: yes Recurrent headaches: no Visual changes: no Palpitations: no Dyspnea: no Chest pain: no Lower extremity edema: no Dizzy/lightheaded: no  The 10-year ASCVD risk score (Arnett DK, et al., 2019) is: 8.8%   Values used to calculate the score:     Age: 87 years     Sex: Female     Is Non-Hispanic African American: No     Diabetic: No     Tobacco smoker: No     Systolic Blood Pressure: 126 mmHg     Is BP treated: Yes     HDL Cholesterol: 75 mg/dL     Total Cholesterol: 184 mg/dL   GERD Taking Nexium for heart burn, if does not take symptoms return. GERD control status: stable Satisfied with current treatment? yes Heartburn frequency: none Medication side effects: no  Medication compliance: stable Dysphagia: no Odynophagia:  no Hematemesis: no Blood in stool: no EGD: yes      02/19/2024    9:46 AM 09/01/2023    8:25 AM 07/14/2023    3:19 PM 01/23/2023    8:18 AM 07/23/2022    8:11 AM  Depression screen PHQ 2/9  Decreased Interest 0 0 0 0 0  Down, Depressed, Hopeless 1 0 0 0 0  PHQ - 2 Score 1 0 0 0 0  Altered sleeping 0 0 0 0 0   Tired, decreased energy 0 0 0 0 1  Change in appetite 1 0 0 1 1  Feeling bad or failure about yourself  0 0 0 0 0  Trouble concentrating 0 0 0 0 1  Moving slowly or fidgety/restless 0 0 0 0 0  Suicidal thoughts 0 0 0 0 0  PHQ-9 Score 2 0 0 1 3  Difficult doing work/chores Not difficult at all Not difficult at all Not difficult at all         02/19/2024    9:46 AM 09/01/2023    8:26 AM 01/23/2023    8:19 AM 07/23/2022    8:11 AM  GAD 7 : Generalized Anxiety Score  Nervous, Anxious, on Edge 0 0 0 1  Control/stop worrying 0 0 0 1  Worry too much - different things 0 0 0 1  Trouble relaxing 0 0 0 1  Restless 0 0 0 1  Easily annoyed or irritable 0 0 0 1  Afraid - awful might happen 0 0 0 1  Total GAD 7 Score 0 0 0 7  Anxiety Difficulty Not difficult at all   Not difficult at all   Relevant past medical,  surgical, family and social history reviewed and updated as indicated. Interim medical history since our last visit reviewed. Allergies and medications reviewed and updated.  Review of Systems  Constitutional:  Negative for activity change, appetite change, diaphoresis, fatigue and fever.  Respiratory:  Negative for cough, chest tightness and shortness of breath.   Cardiovascular:  Negative for chest pain, palpitations and leg swelling.  Gastrointestinal: Negative.   Endocrine: Negative for cold intolerance, heat intolerance, polydipsia, polyphagia and polyuria.  Neurological: Negative.   Psychiatric/Behavioral: Negative.      Per HPI unless specifically indicated above     Objective:    BP 126/70 (BP Location: Left Arm, Patient Position: Sitting, Cuff Size: Large)   Pulse 70   Temp 98.1 F (36.7 C) (Oral)   Ht 5' 3.4" (1.61 m)   Wt 261 lb (118.4 kg)   SpO2 98%   BMI 45.65 kg/m   Wt Readings from Last 3 Encounters:  02/19/24 261 lb (118.4 kg)  09/01/23 261 lb 3.2 oz (118.5 kg)  07/14/23 251 lb (113.9 kg)    Physical Exam Vitals and nursing note reviewed.   Constitutional:      General: She is awake. She is not in acute distress.    Appearance: She is well-developed and well-groomed. She is obese. She is not ill-appearing or toxic-appearing.  HENT:     Head: Normocephalic.     Right Ear: Hearing normal.     Left Ear: Hearing normal.  Eyes:     General: Lids are normal.        Right eye: No discharge.        Left eye: No discharge.     Conjunctiva/sclera: Conjunctivae normal.     Pupils: Pupils are equal, round, and reactive to light.  Neck:     Thyroid: No thyromegaly.     Vascular: No carotid bruit or JVD.  Cardiovascular:     Rate and Rhythm: Normal rate and regular rhythm.     Heart sounds: Normal heart sounds. No murmur heard.    No gallop.  Pulmonary:     Effort: Pulmonary effort is normal. No accessory muscle usage or respiratory distress.     Breath sounds: Normal breath sounds.  Abdominal:     General: Bowel sounds are normal.     Palpations: Abdomen is soft.  Musculoskeletal:     Cervical back: Normal range of motion and neck supple.     Right lower leg: No edema.     Left lower leg: No edema.  Lymphadenopathy:     Cervical: No cervical adenopathy.  Skin:    General: Skin is warm and dry.  Neurological:     Mental Status: She is alert and oriented to person, place, and time.     Deep Tendon Reflexes:     Reflex Scores:      Brachioradialis reflexes are 1+ on the right side and 1+ on the left side.      Patellar reflexes are 1+ on the right side and 1+ on the left side. Psychiatric:        Attention and Perception: Attention normal.        Mood and Affect: Mood normal.        Speech: Speech normal.        Behavior: Behavior normal. Behavior is cooperative.        Thought Content: Thought content normal.    Results for orders placed or performed in visit on 09/01/23  Microscopic Examination  Collection Time: 09/01/23  8:56 AM   Urine  Result Value Ref Range   WBC, UA 0-5 0 - 5 /hpf   RBC, Urine 0-2 0 - 2  /hpf   Epithelial Cells (non renal) 0-10 0 - 10 /hpf   Bacteria, UA None seen None seen/Few  Urinalysis, Routine w reflex microscopic   Collection Time: 09/01/23  8:56 AM  Result Value Ref Range   Specific Gravity, UA 1.015 1.005 - 1.030   pH, UA 8.0 (H) 5.0 - 7.5   Color, UA Yellow Yellow   Appearance Ur Cloudy (A) Clear   Leukocytes,UA Trace (A) Negative   Protein,UA Negative Negative/Trace   Glucose, UA Negative Negative   Ketones, UA Negative Negative   RBC, UA Negative Negative   Bilirubin, UA Negative Negative   Urobilinogen, Ur 0.2 0.2 - 1.0 mg/dL   Nitrite, UA Negative Negative   Microscopic Examination See below:   CBC with Differential/Platelet   Collection Time: 09/01/23  8:58 AM  Result Value Ref Range   WBC 6.8 3.4 - 10.8 x10E3/uL   RBC 4.72 3.77 - 5.28 x10E6/uL   Hemoglobin 13.7 11.1 - 15.9 g/dL   Hematocrit 60.4 54.0 - 46.6 %   MCV 91 79 - 97 fL   MCH 29.0 26.6 - 33.0 pg   MCHC 32.1 31.5 - 35.7 g/dL   RDW 98.1 19.1 - 47.8 %   Platelets 274 150 - 450 x10E3/uL   Neutrophils 74 Not Estab. %   Lymphs 16 Not Estab. %   Monocytes 8 Not Estab. %   Eos 2 Not Estab. %   Basos 0 Not Estab. %   Neutrophils Absolute 5.0 1.4 - 7.0 x10E3/uL   Lymphocytes Absolute 1.1 0.7 - 3.1 x10E3/uL   Monocytes Absolute 0.5 0.1 - 0.9 x10E3/uL   EOS (ABSOLUTE) 0.1 0.0 - 0.4 x10E3/uL   Basophils Absolute 0.0 0.0 - 0.2 x10E3/uL   Immature Granulocytes 0 Not Estab. %   Immature Grans (Abs) 0.0 0.0 - 0.1 x10E3/uL  Comprehensive metabolic panel   Collection Time: 09/01/23  8:58 AM  Result Value Ref Range   Glucose 108 (H) 70 - 99 mg/dL   BUN 11 8 - 27 mg/dL   Creatinine, Ser 2.95 0.57 - 1.00 mg/dL   eGFR 97 >62 ZH/YQM/5.78   BUN/Creatinine Ratio 17 12 - 28   Sodium 140 134 - 144 mmol/L   Potassium 3.6 3.5 - 5.2 mmol/L   Chloride 97 96 - 106 mmol/L   CO2 24 20 - 29 mmol/L   Calcium 9.8 8.7 - 10.3 mg/dL   Total Protein 7.2 6.0 - 8.5 g/dL   Albumin 4.6 3.9 - 4.9 g/dL   Globulin,  Total 2.6 1.5 - 4.5 g/dL   Bilirubin Total 0.6 0.0 - 1.2 mg/dL   Alkaline Phosphatase 143 (H) 44 - 121 IU/L   AST 19 0 - 40 IU/L   ALT 27 0 - 32 IU/L  TSH   Collection Time: 09/01/23  8:58 AM  Result Value Ref Range   TSH 3.460 0.450 - 4.500 uIU/mL  Lipid Panel w/o Chol/HDL Ratio   Collection Time: 09/01/23  8:58 AM  Result Value Ref Range   Cholesterol, Total 184 100 - 199 mg/dL   Triglycerides 469 (H) 0 - 149 mg/dL   HDL 75 >62 mg/dL   VLDL Cholesterol Cal 27 5 - 40 mg/dL   LDL Chol Calc (NIH) 82 0 - 99 mg/dL  Magnesium   Collection Time: 09/01/23  8:58 AM  Result Value Ref Range   Magnesium 2.0 1.6 - 2.3 mg/dL  Amylase   Collection Time: 09/01/23  8:58 AM  Result Value Ref Range   Amylase 54 31 - 110 U/L  Lipase   Collection Time: 09/01/23  8:58 AM  Result Value Ref Range   Lipase 22 14 - 72 U/L  HgB A1c   Collection Time: 09/01/23  8:58 AM  Result Value Ref Range   Hgb A1c MFr Bld 5.6 4.8 - 5.6 %   Est. average glucose Bld gHb Est-mCnc 114 mg/dL      Assessment & Plan:   Problem List Items Addressed This Visit       Cardiovascular and Mediastinum   Hypertension, essential   Chronic, stable.  BP at goal in office and on home checks.  Recommend she monitor BP at least a few mornings a week at home and document.  DASH diet at home.  Continue current medication regimen and adjust as needed.  Refills sent in.  Labs: CMP.  Return in 6 months.        Digestive   GERD (gastroesophageal reflux disease)   Ongoing with use of Nexium.  Continue to monitor and adjust regimen as needed.  Mag level annually.        Other   Mixed hyperlipidemia   Chronic, ongoing.  She is tolerating Rosuvastatin.  Recommend she continue this and we will adjust as needed.  Lipid panel and CMP today.      Relevant Orders   Comprehensive metabolic panel   Lipid Panel w/o Chol/HDL Ratio   Obesity - Primary   BMI 45.65.  Recommended eating smaller high protein, low fat meals more  frequently and exercising 30 mins a day 5 times a week with a goal of 10-15lb weight loss in the next 3 months. Patient voiced their understanding and motivation to adhere to these recommendations.       PMS2-related Lynch syndrome (HNPCC4)   Chronic.  Genetic testing on 10/16/21 performed.  Continue to follow with GI for scheduled EGD and colonoscopies.  Amylase and Lipase + CMP at annual physical.        Follow up plan: Return for Annual Physical after 08/31/24.

## 2024-02-19 NOTE — Assessment & Plan Note (Signed)
 Chronic, stable.  BP at goal in office and on home checks.  Recommend she monitor BP at least a few mornings a week at home and document.  DASH diet at home.  Continue current medication regimen and adjust as needed.  Refills sent in.  Labs: CMP.  Return in 6 months.

## 2024-02-19 NOTE — Assessment & Plan Note (Signed)
Chronic.  Genetic testing on 10/16/21 performed.  Continue to follow with GI for scheduled EGD and colonoscopies.  Amylase and Lipase + CMP at annual physical.

## 2024-02-20 ENCOUNTER — Encounter: Payer: Self-pay | Admitting: Nurse Practitioner

## 2024-02-20 LAB — COMPREHENSIVE METABOLIC PANEL
ALT: 32 IU/L (ref 0–32)
AST: 37 IU/L (ref 0–40)
Albumin: 4.5 g/dL (ref 3.9–4.9)
Alkaline Phosphatase: 139 IU/L — ABNORMAL HIGH (ref 44–121)
BUN/Creatinine Ratio: 14 (ref 12–28)
BUN: 9 mg/dL (ref 8–27)
Bilirubin Total: 0.6 mg/dL (ref 0.0–1.2)
CO2: 26 mmol/L (ref 20–29)
Calcium: 9.8 mg/dL (ref 8.7–10.3)
Chloride: 97 mmol/L (ref 96–106)
Creatinine, Ser: 0.64 mg/dL (ref 0.57–1.00)
Globulin, Total: 2.9 g/dL (ref 1.5–4.5)
Glucose: 109 mg/dL — ABNORMAL HIGH (ref 70–99)
Potassium: 4 mmol/L (ref 3.5–5.2)
Sodium: 140 mmol/L (ref 134–144)
Total Protein: 7.4 g/dL (ref 6.0–8.5)
eGFR: 96 mL/min/{1.73_m2} (ref >59)

## 2024-02-20 LAB — LIPID PANEL W/O CHOL/HDL RATIO
Cholesterol, Total: 164 mg/dL (ref 100–199)
HDL: 80 mg/dL (ref >39)
LDL Chol Calc (NIH): 67 mg/dL (ref 0–99)
Triglycerides: 91 mg/dL (ref 0–149)
VLDL Cholesterol Cal: 17 mg/dL (ref 5–40)

## 2024-02-20 NOTE — Progress Notes (Signed)
 Contacted via MyChart   Good evening Pamela Reid, your labs have returned and overall remain stable. Kidney function, creatinine and eGFR, remains normal, as is liver function, AST and ALT. Lipid panel at goal levels.  No medication changes needed.  Keep up the good work.  Any questions? Keep being stellar!!  Thank you for allowing me to participate in your care.  I appreciate you. Kindest regards, Dijuan Sleeth

## 2024-02-23 ENCOUNTER — Ambulatory Visit
Admission: RE | Admit: 2024-02-23 | Discharge: 2024-02-23 | Disposition: A | Discharge status: Home / Self Care | Source: Ambulatory Visit | Attending: Nurse Practitioner | Admitting: Nurse Practitioner

## 2024-02-23 DIAGNOSIS — Z1231 Encounter for screening mammogram for malignant neoplasm of breast: Secondary | ICD-10-CM | POA: Insufficient documentation

## 2024-02-23 DIAGNOSIS — Z803 Family history of malignant neoplasm of breast: Secondary | ICD-10-CM | POA: Diagnosis not present

## 2024-02-25 ENCOUNTER — Other Ambulatory Visit: Payer: Self-pay | Admitting: Nurse Practitioner

## 2024-02-25 DIAGNOSIS — R928 Other abnormal and inconclusive findings on diagnostic imaging of breast: Secondary | ICD-10-CM

## 2024-03-01 ENCOUNTER — Encounter

## 2024-03-01 ENCOUNTER — Other Ambulatory Visit

## 2024-03-01 ENCOUNTER — Ambulatory Visit: Payer: Self-pay | Admitting: Nurse Practitioner

## 2024-03-03 ENCOUNTER — Encounter

## 2024-03-03 ENCOUNTER — Other Ambulatory Visit

## 2024-03-09 ENCOUNTER — Ambulatory Visit
Admission: RE | Admit: 2024-03-09 | Discharge: 2024-03-09 | Disposition: A | Discharge status: Home / Self Care | Source: Ambulatory Visit | Attending: Nurse Practitioner | Admitting: Nurse Practitioner

## 2024-03-09 ENCOUNTER — Other Ambulatory Visit: Payer: Self-pay | Admitting: Nurse Practitioner

## 2024-03-09 DIAGNOSIS — R928 Other abnormal and inconclusive findings on diagnostic imaging of breast: Secondary | ICD-10-CM | POA: Insufficient documentation

## 2024-03-09 DIAGNOSIS — R921 Mammographic calcification found on diagnostic imaging of breast: Secondary | ICD-10-CM | POA: Diagnosis not present

## 2024-03-09 DIAGNOSIS — R92321 Mammographic fibroglandular density, right breast: Secondary | ICD-10-CM | POA: Diagnosis not present

## 2024-03-09 DIAGNOSIS — N6311 Unspecified lump in the right breast, upper outer quadrant: Secondary | ICD-10-CM | POA: Diagnosis not present

## 2024-03-22 ENCOUNTER — Ambulatory Visit
Admission: RE | Admit: 2024-03-22 | Discharge: 2024-03-22 | Disposition: A | Discharge status: Home / Self Care | Source: Ambulatory Visit | Attending: Nurse Practitioner | Admitting: Nurse Practitioner

## 2024-03-22 DIAGNOSIS — R928 Other abnormal and inconclusive findings on diagnostic imaging of breast: Secondary | ICD-10-CM | POA: Diagnosis not present

## 2024-03-22 DIAGNOSIS — C50411 Malignant neoplasm of upper-outer quadrant of right female breast: Secondary | ICD-10-CM | POA: Insufficient documentation

## 2024-03-22 DIAGNOSIS — N6489 Other specified disorders of breast: Secondary | ICD-10-CM | POA: Insufficient documentation

## 2024-03-22 DIAGNOSIS — R921 Mammographic calcification found on diagnostic imaging of breast: Secondary | ICD-10-CM | POA: Diagnosis not present

## 2024-03-22 HISTORY — PX: BREAST BIOPSY: SHX20

## 2024-03-22 MED ORDER — LIDOCAINE-EPINEPHRINE 1 %-1:100000 IJ SOLN
10.0000 mL | Freq: Once | INTRAMUSCULAR | Status: AC
Start: 2024-03-22 — End: 2024-03-22
  Administered 2024-03-22: 10 mL
  Filled 2024-03-22: qty 10

## 2024-03-22 MED ORDER — LIDOCAINE-EPINEPHRINE 1 %-1:100000 IJ SOLN
8.0000 mL | Freq: Once | INTRAMUSCULAR | Status: AC
Start: 2024-03-22 — End: 2024-03-22
  Administered 2024-03-22: 8 mL
  Filled 2024-03-22: qty 8

## 2024-03-22 MED ORDER — LIDOCAINE 1 % OPTIME INJ - NO CHARGE
2.0000 mL | Freq: Once | INTRAMUSCULAR | Status: AC
Start: 2024-03-22 — End: 2024-03-22
  Administered 2024-03-22: 2 mL
  Filled 2024-03-22: qty 2

## 2024-03-22 MED ORDER — LIDOCAINE 1 % OPTIME INJ - NO CHARGE
5.0000 mL | Freq: Once | INTRAMUSCULAR | Status: AC
  Administered 2024-03-22: 5 mL
  Filled 2024-03-22: qty 6

## 2024-03-23 ENCOUNTER — Encounter: Payer: Self-pay | Admitting: *Deleted

## 2024-03-23 ENCOUNTER — Encounter: Payer: Self-pay | Admitting: Nurse Practitioner

## 2024-03-23 DIAGNOSIS — C50911 Malignant neoplasm of unspecified site of right female breast: Secondary | ICD-10-CM | POA: Insufficient documentation

## 2024-03-23 LAB — SURGICAL PATHOLOGY

## 2024-03-23 NOTE — Progress Notes (Signed)
 Received referral for newly diagnosed breast cancer from [facility].  Navigation initiated.  She would like to see Dr. Delane Fear, Referral sent to [facility].   She will see Dr. Wilhelmenia Harada on Monday at 9:30.

## 2024-03-28 ENCOUNTER — Inpatient Hospital Stay

## 2024-03-28 ENCOUNTER — Encounter: Payer: Self-pay | Admitting: Oncology

## 2024-03-28 ENCOUNTER — Inpatient Hospital Stay: Attending: Oncology | Admitting: Oncology

## 2024-03-28 VITALS — BP 143/71 | HR 66 | Temp 98.0°F | Resp 18 | Wt 254.3 lb

## 2024-03-28 DIAGNOSIS — C50911 Malignant neoplasm of unspecified site of right female breast: Secondary | ICD-10-CM | POA: Diagnosis not present

## 2024-03-28 DIAGNOSIS — Z1732 Human epidermal growth factor receptor 2 negative status: Secondary | ICD-10-CM | POA: Insufficient documentation

## 2024-03-28 DIAGNOSIS — C50919 Malignant neoplasm of unspecified site of unspecified female breast: Secondary | ICD-10-CM | POA: Insufficient documentation

## 2024-03-28 DIAGNOSIS — Z803 Family history of malignant neoplasm of breast: Secondary | ICD-10-CM | POA: Diagnosis not present

## 2024-03-28 DIAGNOSIS — Z90710 Acquired absence of both cervix and uterus: Secondary | ICD-10-CM | POA: Diagnosis not present

## 2024-03-28 DIAGNOSIS — Z1509 Genetic susceptibility to other malignant neoplasm: Secondary | ICD-10-CM | POA: Insufficient documentation

## 2024-03-28 DIAGNOSIS — Z800 Family history of malignant neoplasm of digestive organs: Secondary | ICD-10-CM | POA: Diagnosis not present

## 2024-03-28 DIAGNOSIS — Z1721 Progesterone receptor positive status: Secondary | ICD-10-CM | POA: Insufficient documentation

## 2024-03-28 DIAGNOSIS — Z170 Estrogen receptor positive status [ER+]: Secondary | ICD-10-CM | POA: Diagnosis not present

## 2024-03-28 LAB — CBC WITH DIFFERENTIAL/PLATELET
Abs Immature Granulocytes: 0.04 10*3/uL (ref 0.00–0.07)
Basophils Absolute: 0 10*3/uL (ref 0.0–0.1)
Basophils Relative: 0 %
Eosinophils Absolute: 0.1 10*3/uL (ref 0.0–0.5)
Eosinophils Relative: 1 %
HCT: 40.2 % (ref 36.0–46.0)
Hemoglobin: 13.4 g/dL (ref 12.0–15.0)
Immature Granulocytes: 1 %
Lymphocytes Relative: 14 %
Lymphs Abs: 1 10*3/uL (ref 0.7–4.0)
MCH: 29.3 pg (ref 26.0–34.0)
MCHC: 33.3 g/dL (ref 30.0–36.0)
MCV: 88 fL (ref 80.0–100.0)
Monocytes Absolute: 0.6 10*3/uL (ref 0.1–1.0)
Monocytes Relative: 8 %
Neutro Abs: 5.6 10*3/uL (ref 1.7–7.7)
Neutrophils Relative %: 76 %
Platelets: 253 10*3/uL (ref 150–400)
RBC: 4.57 MIL/uL (ref 3.87–5.11)
RDW: 12.5 % (ref 11.5–15.5)
WBC: 7.3 10*3/uL (ref 4.0–10.5)
nRBC: 0 % (ref 0.0–0.2)

## 2024-03-28 LAB — COMPREHENSIVE METABOLIC PANEL WITH GFR
ALT: 33 U/L (ref 0–44)
AST: 21 U/L (ref 15–41)
Albumin: 3.9 g/dL (ref 3.5–5.0)
Alkaline Phosphatase: 122 U/L (ref 38–126)
Anion gap: 12 (ref 5–15)
BUN: 8 mg/dL (ref 8–23)
CO2: 24 mmol/L (ref 22–32)
Calcium: 9.3 mg/dL (ref 8.9–10.3)
Chloride: 99 mmol/L (ref 98–111)
Creatinine, Ser: 0.62 mg/dL (ref 0.44–1.00)
GFR, Estimated: >60 mL/min (ref >60)
Glucose, Bld: 121 mg/dL — ABNORMAL HIGH (ref 70–99)
Potassium: 3.6 mmol/L (ref 3.5–5.1)
Sodium: 135 mmol/L (ref 135–145)
Total Bilirubin: 0.7 mg/dL (ref 0.0–1.2)
Total Protein: 7.3 g/dL (ref 6.5–8.1)

## 2024-03-28 NOTE — Assessment & Plan Note (Signed)
 Association between Lynch syndrome to breast cancer is unknown. Will touch base with genetic counselor to see if she needs any additional testing given personal and family history of breast cancer.

## 2024-03-28 NOTE — Progress Notes (Signed)
 Hematology/Oncology Consult Note [phone] [phone]     REFERRING PROVIDER: Lemar Pyles, NP    CHIEF COMPLAINTS/PURPOSE OF CONSULTATION:  Stage I right breast invasive carcinoma  ASSESSMENT & PLAN:   Invasive carcinoma of breast (HCC) Stage I right breast invasive carcinoma.  Pathology and radiology counseling: Discussed with the patient, the details of pathology including the type of breast cancer,the clinical staging, the significance of ER, PR and HER-2/neu receptors and the implications for treatment. After reviewing the pathology in detail, we proceeded to discuss the different treatment options between surgery, radiation, chemotherapy, antiestrogen therapies.  Treatment plan: 1.  Breast conserving surgery with sentinel lymph node biopsy 2. If final tumor size is >68mm, will send off Oncotype DX testing to determine if she would benefit from adjuvant chemo. If <= 5mm, will omit testing.  3.  Adjuvant radiation 4.  Adjuvant antiestrogen therapy  Return to clinic based upon surgery pathology and possible Oncotype DX test result   PMS2-related Lynch syndrome (HNPCC4) Association between Lynch syndrome to breast cancer is unknown. Will touch base with genetic counselor to see if she needs any additional testing given personal and family history of breast cancer.   Orders Placed This Encounter  Procedures   CBC with Differential/Platelet    Standing Status:   Future    Number of Occurrences:   1    Expected Date:   03/28/2024    Expiration Date:   03/28/2025   Comprehensive metabolic panel with GFR    Standing Status:   Future    Number of Occurrences:   1    Expected Date:   03/28/2024    Expiration Date:   03/28/2025   Cancer antigen 15-3    Standing Status:   Future    Number of Occurrences:   1    Expected Date:   03/28/2024    Expiration Date:   03/28/2025   Cancer antigen 27.29    Standing Status:   Future    Number of Occurrences:   1     Expected Date:   03/28/2024    Expiration Date:   03/28/2025   Follow-up to be determined. All questions were answered. The patient knows to call the clinic with any problems, questions or concerns.  Timmy Forbes, MD, PhD [facility] Hematology Oncology 03/28/2024    HISTORY OF PRESENTING ILLNESS:  Pamela Reid 68 y.o. female presents to establish care for  I have reviewed her chart and materials related to her cancer extensively and collaborated history with the patient. Summary of oncologic history is as follows: Oncology History  Invasive carcinoma of breast (HCC)  02/23/2024 Mammogram   Bilateral screening mammogram  In the right breast, possible distortion and separate calcifications warrant further evaluation. In the left breast, no findings suspicious for malignancy.   03/09/2024 Mammogram   Bilateral diagnostic mammogram w US  1. RIGHT breast 5 mm spiculated mass in the 11 o'clock position is intermediate suspicion for malignancy. Recommend further assessment with ultrasound-guided biopsy. 2. RIGHT breast 9 mm group of coarse heterogeneous calcification is low suspicion for malignancy. Recommend further assessment with stereotactic guided biopsy.  Targeted right axillary ultrasound demonstrates multiple morphologically benign lymph nodes. No lymphadenopathy   03/28/2024 Initial Diagnosis   Invasive carcinoma of breast (HCC)   03/28/2024 Cancer Staging   Staging form: Breast, AJCC 8th Edition - Clinical stage from 03/28/2024: Stage IA (cT1a, cN0, cM0, G1, ER+, PR+, HER2-) - Signed by Timmy Forbes, MD on 03/28/2024 Stage  prefix: Initial diagnosis Histologic grading system: 3 grade system   Patient has a history of early-stage uterine cancer status post total hysterectomy.  Strong family history of GI cancer.  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 Positive for family history of breast cancer. Patient has no breast complaints.  She  has appointment with surgeon tomorrow. Accompanied by her husband today.  MEDICAL HISTORY:  Past Medical History:  Diagnosis Date   Allergy    Cancer [facility])    Family history of breast cancer    Family history of melanoma    GERD (gastroesophageal reflux disease)    Heart murmur as a child - mitral valve   Hypertension    Lynch syndrome    Thyroid  disease Thyroid  biopsy for tumor 2013    SURGICAL HISTORY: Past Surgical History:  Procedure Laterality Date   ABDOMINAL HYSTERECTOMY     BREAST BIOPSY Left 2019   stereotatic bx neg   BREAST BIOPSY Right 03/22/2024   Stereo bx, ribbon clip, path pending   BREAST BIOPSY Right 03/22/2024   MM RT BREAST BX W LOC DEV 1ST LESION IMAGE BX SPEC STEREO GUIDE 03/22/2024 [facility]   BREAST BIOPSY Right 03/22/2024   US  RT BREAST BX W LOC DEV 1ST LESION IMG BX SPEC US  GUIDE 03/22/2024 [facility]   CHOLECYSTECTOMY     COLON SURGERY  2017   COLONOSCOPY WITH PROPOFOL  N/A 09/11/2021   Procedure: COLONOSCOPY WITH PROPOFOL ;  Surgeon: Selena Daily, MD;  Location: [facility];  Service: Gastroenterology;  Laterality: N/A;   TUBAL LIGATION  1986    SOCIAL HISTORY: Social History   Socioeconomic History   Marital status: Married    Spouse name: Lavonia Powers   Number of children: 2   Years of education: Not on file   Highest education level: Doctorate  Occupational History   Occupation: part time Catering manager & teaching  Tobacco Use   Smoking status: Never   Smokeless tobacco: Never  Vaping Use   Vaping status: Never Used  Substance and Sexual Activity   Alcohol use: Yes    Alcohol/week: 1.0 standard drink of alcohol    Types: 1 Glasses of wine per week    Comment: occasional   Drug use: Never   Sexual activity: Yes    Birth control/protection: Post-menopausal  Other Topics Concern   Not on file  Social History Narrative   Married, 2 children and 7 grandchildren   Social Drivers of Corporate investment banker Strain: Low  Risk  (02/15/2024)   Overall Financial Resource Strain (CARDIA)    Difficulty of Paying Living Expenses: Not hard at all  Food Insecurity: No Food Insecurity (02/15/2024)   Hunger Vital Sign    Worried About Running Out of Food in the Last Year: Never true    Ran Out of Food in the Last Year: Never true  Transportation Needs: No Transportation Needs (02/15/2024)   PRAPARE - Administrator, Civil Service (Medical): No    Lack of Transportation (Non-Medical): No  Physical Activity: Sufficiently Active (02/15/2024)   Exercise Vital Sign    Days of Exercise per Week: 4 days    Minutes of Exercise per Session: 60 min  Stress: No Stress Concern Present (02/15/2024)   [employer] of Occupational Health - Occupational Stress Questionnaire    Feeling of Stress : Only a little  Social Connections: Moderately Integrated (02/15/2024)   Social Connection and Isolation Panel [NHANES]  Frequency of Communication with Friends and Family: More than three times a week    Frequency of Social Gatherings with Friends and Family: More than three times a week    Attends Religious Services: Never    Database administrator or Organizations: No    Attends Engineer, structural: More than 4 times per year    Marital Status: Married  Catering manager Violence: Not At Risk (07/14/2023)   Humiliation, Afraid, Rape, and Kick questionnaire    Fear of Current or Ex-Partner: No    Emotionally Abused: No    Physically Abused: No    Sexually Abused: No    FAMILY HISTORY: Family History  Problem Relation Age of Onset   Hypertension Mother    Melanoma Mother    Asthma Mother    Hyperlipidemia Mother    Parkinson's disease Father    Dementia Father    Hyperlipidemia Father    Hypertension Father    Healthy Sister    Healthy Brother    Breast cancer Maternal Aunt        dx 45s   Melanoma Paternal Aunt    Cancer Maternal Grandmother        "GI cancer" dx 7s   Cancer Maternal  Grandfather        "GI cancer" dx 59s   Breast cancer Paternal Grandmother        dx 58s   Cancer Paternal Grandmother    Parkinson's disease Paternal Grandfather    Healthy Daughter    Healthy Son    Cancer Maternal Aunt     ALLERGIES:  is allergic to cephalexin and penicillins.  MEDICATIONS:  Current Outpatient Medications  Medication Sig Dispense Refill   Aspirin 81 MG CAPS Take by mouth.     Calcium  Polycarbophil (FIBER) 625 MG TABS Take by mouth.     Cholecalciferol 75 MCG (3000 UT) TABS Take by mouth.     co-enzyme Q-10 30 MG capsule Take by mouth.     esomeprazole (NEXIUM) 20 MG capsule Take by mouth.     ibuprofen (ADVIL) 200 MG tablet Take by mouth.     ketoconazole  (NIZORAL ) 2 % shampoo SHAMPOO INTO THE SCALP LET SIT 5 TO 10 MINUTES THEN WASH OFF USE  3 DAYS PER WEEK 120 mL 2   losartan -hydrochlorothiazide (HYZAAR) 100-25 MG tablet Take 1 tablet by mouth daily. 90 tablet 4   mometasone  (ELOCON ) 0.1 % lotion APPLY TOPICALLY DAILY AS NEEDED UP TO 5 DAYS PER WEEK 120 mL 2   Multiple Vitamin (MULTI-VITAMINS) TABS Take 1 tablet by mouth daily.     nebivolol  (BYSTOLIC ) 2.5 MG tablet TAKE 1 TABLET DAILY BY MOUTH FOR HYPERTENSION 90 tablet 0   rosuvastatin  (CRESTOR ) 20 MG tablet Take 1 tablet (20 mg total) by mouth daily. 90 tablet 4   No current facility-administered medications for this visit.    Review of Systems  Constitutional:  Negative for appetite change, chills, fatigue and fever.  HENT:   Negative for hearing loss and voice change.   Eyes:  Negative for eye problems.  Respiratory:  Negative for chest tightness and cough.   Cardiovascular:  Negative for chest pain.  Gastrointestinal:  Negative for abdominal distention, abdominal pain and blood in stool.  Endocrine: Negative for hot flashes.  Genitourinary:  Negative for difficulty urinating and frequency.   Musculoskeletal:  Negative for arthralgias.  Skin:  Negative for itching and rash.  Neurological:  Negative  for extremity weakness.  Hematological:  Negative for adenopathy.  Psychiatric/Behavioral:  Negative for confusion.      PHYSICAL EXAMINATION: ECOG PERFORMANCE STATUS: 0 - Asymptomatic  Vitals:   03/28/24 0935  BP: (!) 143/71  Pulse: 66  Resp: 18  Temp: 98 F (36.7 C)  SpO2: 96%   Filed Weights   03/28/24 0935  Weight: 254 lb 4.8 oz (115.3 kg)    Physical Exam Constitutional:      General: She is not in acute distress.    Appearance: She is obese. She is not diaphoretic.  HENT:     Head: Normocephalic.  Eyes:     General: No scleral icterus. Cardiovascular:     Rate and Rhythm: Normal rate and regular rhythm.     Heart sounds: No murmur heard. Pulmonary:     Effort: Pulmonary effort is normal. No respiratory distress.     Breath sounds: No wheezing.  Abdominal:     General: There is no distension.     Palpations: Abdomen is soft.     Tenderness: There is no abdominal tenderness.  Musculoskeletal:        General: Normal range of motion.     Cervical back: Normal range of motion and neck supple.  Skin:    General: Skin is warm and dry.     Findings: No erythema.  Neurological:     Mental Status: She is alert and oriented to person, place, and time.     Cranial Nerves: No cranial nerve deficit.     Motor: No abnormal muscle tone.     Coordination: Coordination normal.  Psychiatric:        Mood and Affect: Affect normal.    Breast examination was performed Right breast status postbiopsy with focal tissue swelling. No palpable mass bilaterally.  No palpable axillary lymphadenopathy bilaterally.  LABORATORY DATA:  I have reviewed the data as listed    Latest Ref Rng & Units 03/28/2024   10:21 AM 09/01/2023    8:58 AM 07/23/2022    8:27 AM  CBC  WBC 4.0 - 10.5 K/uL 7.3  6.8  6.6   Hemoglobin 12.0 - 15.0 g/dL 95.2  84.1  32.4   Hematocrit 36.0 - 46.0 % 40.2  42.7  41.1   Platelets 150 - 400 K/uL 253  274  247       Latest Ref Rng & Units 03/28/2024    10:21 AM 02/19/2024   10:18 AM 09/01/2023    8:58 AM  CMP  Glucose 70 - 99 mg/dL 401  027  253   BUN 8 - 23 mg/dL 8  9  11    Creatinine 0.44 - 1.00 mg/dL 6.64  4.03  4.74   Sodium 135 - 145 mmol/L 135  140  140   Potassium 3.5 - 5.1 mmol/L 3.6  4.0  3.6   Chloride 98 - 111 mmol/L 99  97  97   CO2 22 - 32 mmol/L 24  26  24    Calcium  8.9 - 10.3 mg/dL 9.3  9.8  9.8   Total Protein 6.5 - 8.1 g/dL 7.3  7.4  7.2   Total Bilirubin 0.0 - 1.2 mg/dL 0.7  0.6  0.6   Alkaline Phos 38 - 126 U/L 122  139  143   AST 15 - 41 U/L 21  37  19   ALT 0 - 44 U/L 33  32  27      RADIOGRAPHIC STUDIES: I have personally reviewed the radiological images as listed and  agreed with the findings in the report. MM RT BREAST BX W LOC DEV 1ST LESION IMAGE BX SPEC STEREO GUIDE Addendum Date: 03/23/2024 ADDENDUM REPORT: 03/23/2024 14:40 ADDENDUM: PATHOLOGY revealed: Site 1. Breast, right, needle core biopsy, 11 o'clock (coil) : INVASIVE DUCTAL CARCINOMA. OVERALL GRADE: 1 - LYMPHOVASCULAR INVASION: NOT IDENTIFIED. CANCER LENGTH: 5 MM. CALCIFICATIONS: PRESENT. Pathology results are CONCORDANT with imaging findings, per Dr. Sande Cromer. PATHOLOGY revealed: Site 2. Breast, right, needle core biopsy, upper outer quadrant (ribbon) : BENIGN BREAST TISSUE WITH FIBROADENOMATOUS CHANGES AND CALCIFICATIONS. NEGATIVE FOR ATYPIA OR MALIGNANCY. Pathology results are CONCORDANT with imaging findings, per Dr. Sande Cromer. Pathology results and recommendations below were discussed with patient by telephone on 03/23/2024 by Ladonna Pickup RN. Patient reported biopsy site within normal limits with slight tenderness at the site. Post biopsy care instructions were reviewed, questions were answered and my direct phone number was provided to patient. Patient was instructed to call [facility] if any concerns or questions arise related to the biopsy. RECOMMENDATIONS: 1. Surgical and oncological consultation for site 1 only. Request for  surgical and oncological consultation relayed to Gwenn Lenz RN at [facility] by Ladonna Pickup RN on 03/23/2024. Pathology results reported by Ladonna Pickup RN on 03/23/2024. Electronically Signed   By: Sande Cromer M.D.   On: 03/23/2024 14:40   Result Date: 03/23/2024 CLINICAL DATA:  68 year old female presenting for biopsy of indeterminate screen-detected mass and calcifications in the RIGHT breast. EXAM: ULTRASOUND GUIDED RIGHT BREAST CORE NEEDLE BIOPSY; RIGHT BREAST STEREOTACTIC CORE NEEDLE BIOPSY COMPARISON:  Previous exam(s). FINDINGS: The patient and I discussed the procedure of ultrasound-guided and stereotactic-guided biopsy including benefits and alternatives. We discussed the high likelihood of a successful procedure. We discussed the risks of the procedure including infection, bleeding, tissue injury, clip migration, and inadequate sampling. Informed written consent was given. The usual time out protocol was performed immediately prior to the procedure. SITE 1: RIGHT breast mass 11 o'clock 7 cm from nipple (COIL clip) Lesion quadrant: Upper outer quadrant Using sterile technique and 1% Lidocaine  as local anesthetic, under direct ultrasound visualization, a 14 gauge spring-loaded device was used to perform biopsy of the mass in the right breast 11 o'clock position 7 cm from nipple using a lateral approach. At the conclusion of the procedure, a coil shaped tissue marker clip was deployed into the biopsy cavity. SITE 2: RIGHT breast calcifications upper outer quadrant middle depth (RIBBON clip) Lesion quadrant: Upper outer quadrant Using sterile technique and 1% Lidocaine  as local anesthetic, under stereotactic guidance, a 9 gauge vacuum assisted device was used to perform core needle biopsy of calcifications in the upper outer quadrant of the right breast using a superior approach. Specimen radiograph was performed showing representative calcifications. Specimens with calcifications are  identified for pathology. At the conclusion of the procedure, a ribbon shaped tissue marker clip was deployed into the biopsy cavity. Follow-up 2-view mammogram was performed and dictated separately. IMPRESSION: 1. Ultrasound-guided biopsy of a mass in the RIGHT breast 11 o'clock position (COIL clip). No apparent complications. 2. Stereotactic-guided biopsy of calcifications in the RIGHT breast upper-outer quadrant (RIBBON clip). No apparent complications. Electronically Signed: By: Sande Cromer M.D. On: 03/22/2024 09:25   US  RT BREAST BX W LOC DEV 1ST LESION IMG BX SPEC US  GUIDE Addendum Date: 03/23/2024 ADDENDUM REPORT: 03/23/2024 14:40 ADDENDUM: PATHOLOGY revealed: Site 1. Breast, right, needle core biopsy, 11 o'clock (coil) : INVASIVE DUCTAL CARCINOMA. OVERALL GRADE: 1 - LYMPHOVASCULAR INVASION: NOT IDENTIFIED. CANCER  LENGTH: 5 MM. CALCIFICATIONS: PRESENT. Pathology results are CONCORDANT with imaging findings, per Dr. Sande Cromer. PATHOLOGY revealed: Site 2. Breast, right, needle core biopsy, upper outer quadrant (ribbon) : BENIGN BREAST TISSUE WITH FIBROADENOMATOUS CHANGES AND CALCIFICATIONS. NEGATIVE FOR ATYPIA OR MALIGNANCY. Pathology results are CONCORDANT with imaging findings, per Dr. Sande Cromer. Pathology results and recommendations below were discussed with patient by telephone on 03/23/2024 by Ladonna Pickup RN. Patient reported biopsy site within normal limits with slight tenderness at the site. Post biopsy care instructions were reviewed, questions were answered and my direct phone number was provided to patient. Patient was instructed to call [facility] if any concerns or questions arise related to the biopsy. RECOMMENDATIONS: 1. Surgical and oncological consultation for site 1 only. Request for surgical and oncological consultation relayed to Gwenn Lenz RN at [facility] by Ladonna Pickup RN on 03/23/2024. Pathology results reported by Ladonna Pickup RN on  03/23/2024. Electronically Signed   By: Sande Cromer M.D.   On: 03/23/2024 14:40   Result Date: 03/23/2024 CLINICAL DATA:  68 year old female presenting for biopsy of indeterminate screen-detected mass and calcifications in the RIGHT breast. EXAM: ULTRASOUND GUIDED RIGHT BREAST CORE NEEDLE BIOPSY; RIGHT BREAST STEREOTACTIC CORE NEEDLE BIOPSY COMPARISON:  Previous exam(s). FINDINGS: The patient and I discussed the procedure of ultrasound-guided and stereotactic-guided biopsy including benefits and alternatives. We discussed the high likelihood of a successful procedure. We discussed the risks of the procedure including infection, bleeding, tissue injury, clip migration, and inadequate sampling. Informed written consent was given. The usual time out protocol was performed immediately prior to the procedure. SITE 1: RIGHT breast mass 11 o'clock 7 cm from nipple (COIL clip) Lesion quadrant: Upper outer quadrant Using sterile technique and 1% Lidocaine  as local anesthetic, under direct ultrasound visualization, a 14 gauge spring-loaded device was used to perform biopsy of the mass in the right breast 11 o'clock position 7 cm from nipple using a lateral approach. At the conclusion of the procedure, a coil shaped tissue marker clip was deployed into the biopsy cavity. SITE 2: RIGHT breast calcifications upper outer quadrant middle depth (RIBBON clip) Lesion quadrant: Upper outer quadrant Using sterile technique and 1% Lidocaine  as local anesthetic, under stereotactic guidance, a 9 gauge vacuum assisted device was used to perform core needle biopsy of calcifications in the upper outer quadrant of the right breast using a superior approach. Specimen radiograph was performed showing representative calcifications. Specimens with calcifications are identified for pathology. At the conclusion of the procedure, a ribbon shaped tissue marker clip was deployed into the biopsy cavity. Follow-up 2-view mammogram was performed  and dictated separately. IMPRESSION: 1. Ultrasound-guided biopsy of a mass in the RIGHT breast 11 o'clock position (COIL clip). No apparent complications. 2. Stereotactic-guided biopsy of calcifications in the RIGHT breast upper-outer quadrant (RIBBON clip). No apparent complications. Electronically Signed: By: Sande Cromer M.D. On: 03/22/2024 09:25   MM CLIP PLACEMENT RIGHT Result Date: 03/22/2024 CLINICAL DATA:  Status post ultrasound-guided biopsy of a suspicious mass in the RIGHT breast and stereotactic guided biopsy of indeterminate calcifications in the RIGHT breast. EXAM: 3D DIAGNOSTIC RIGHT MAMMOGRAM POST ULTRASOUND AND STEREOTACTIC BIOPSY COMPARISON:  Previous exam(s). ACR Breast Density Category b: There are scattered areas of fibroglandular density. FINDINGS: 3D Mammographic images were obtained following ultrasound and stereotactic guided biopsies of the right breast. The COIL shaped biopsy marking clip is in expected position at the site of biopsy in the right breast 11 o'clock position. The RIBBON shaped biopsy  marking clip is in expected position at the site of biopsy in the upper-outer right breast middle depth. IMPRESSION: Appropriate positioning of the COIL and RIBBON shaped biopsy marking clips at the sites of biopsy in the upper-outer RIGHT breast. Final Assessment: Post Procedure Mammograms for Marker Placement Electronically Signed   By: Sande Cromer M.D.   On: 03/22/2024 09:15   MM 3D DIAGNOSTIC MAMMOGRAM UNILATERAL RIGHT BREAST Result Date: 03/09/2024 CLINICAL DATA:  Screening recall for possible RIGHT breast distortion and separate area of possible RIGHT breast calcifications. EXAM: DIGITAL DIAGNOSTIC UNILATERAL RIGHT MAMMOGRAM WITH TOMOSYNTHESIS AND CAD; ULTRASOUND RIGHT BREAST LIMITED TECHNIQUE: Right digital diagnostic mammography and breast tomosynthesis was performed. The images were evaluated with computer-aided detection. ; Targeted ultrasound examination of the right  breast was performed COMPARISON:  Previous exam(s). ACR Breast Density Category b: There are scattered areas of fibroglandular density. FINDINGS: MAMMOGRAM: Diagnostic views demonstrate a 3 mm spiculated mass in the upper-outer right breast middle depth (spot CC image 26/54, ML image 53/93). This corresponds with the distortion seen on screening mammogram. Spot magnification views demonstrate a 9 mm group of coarse heterogeneous calcification in the upper-outer right breast middle depth. This corresponds with the calcifications seen on screening mammogram and is not definitely stable compared to prior examinations. Notably, the group of calcifications is located approximately 20 mm posterior to the spiculated mass described above. No additional suspicious findings are identified in the right breast. ULTRASOUND: Targeted right breast ultrasound was performed. At 11 o'clock 7 cm from nipple, there is an irregular hypoechoic mass with posterior acoustic shadowing. It measures 5 x 4 x 4 mm. There is no internal vascularity. This corresponds with the spiculated mass seen on mammogram. Targeted right axillary ultrasound demonstrates multiple morphologically benign lymph nodes. No lymphadenopathy. IMPRESSION: 1. RIGHT breast 5 mm spiculated mass in the 11 o'clock position is intermediate suspicion for malignancy. Recommend further assessment with ultrasound-guided biopsy. 2. RIGHT breast 9 mm group of coarse heterogeneous calcification is low suspicion for malignancy. Recommend further assessment with stereotactic guided biopsy. RECOMMENDATION: 1. Right breast ultrasound-guided biopsy (1 site). 2. Right breast stereotactic guided biopsy (1 site). I have discussed the findings and recommendations with the patient. The biopsy procedure was discussed with the patient and questions were answered. Patient expressed their understanding of the biopsy recommendation. Patient will be scheduled for biopsy at her earliest convenience  by the schedulers. Ordering provider will be notified. If applicable, a reminder letter will be sent to the patient regarding the next appointment. BI-RADS CATEGORY  4: Suspicious. Electronically Signed   By: Sande Cromer M.D.   On: 03/09/2024 11:07   US  LIMITED ULTRASOUND INCLUDING AXILLA RIGHT BREAST Result Date: 03/09/2024 CLINICAL DATA:  Screening recall for possible RIGHT breast distortion and separate area of possible RIGHT breast calcifications. EXAM: DIGITAL DIAGNOSTIC UNILATERAL RIGHT MAMMOGRAM WITH TOMOSYNTHESIS AND CAD; ULTRASOUND RIGHT BREAST LIMITED TECHNIQUE: Right digital diagnostic mammography and breast tomosynthesis was performed. The images were evaluated with computer-aided detection. ; Targeted ultrasound examination of the right breast was performed COMPARISON:  Previous exam(s). ACR Breast Density Category b: There are scattered areas of fibroglandular density. FINDINGS: MAMMOGRAM: Diagnostic views demonstrate a 3 mm spiculated mass in the upper-outer right breast middle depth (spot CC image 26/54, ML image 53/93). This corresponds with the distortion seen on screening mammogram. Spot magnification views demonstrate a 9 mm group of coarse heterogeneous calcification in the upper-outer right breast middle depth. This corresponds with the calcifications seen on screening mammogram and is  not definitely stable compared to prior examinations. Notably, the group of calcifications is located approximately 20 mm posterior to the spiculated mass described above. No additional suspicious findings are identified in the right breast. ULTRASOUND: Targeted right breast ultrasound was performed. At 11 o'clock 7 cm from nipple, there is an irregular hypoechoic mass with posterior acoustic shadowing. It measures 5 x 4 x 4 mm. There is no internal vascularity. This corresponds with the spiculated mass seen on mammogram. Targeted right axillary ultrasound demonstrates multiple morphologically benign lymph  nodes. No lymphadenopathy. IMPRESSION: 1. RIGHT breast 5 mm spiculated mass in the 11 o'clock position is intermediate suspicion for malignancy. Recommend further assessment with ultrasound-guided biopsy. 2. RIGHT breast 9 mm group of coarse heterogeneous calcification is low suspicion for malignancy. Recommend further assessment with stereotactic guided biopsy. RECOMMENDATION: 1. Right breast ultrasound-guided biopsy (1 site). 2. Right breast stereotactic guided biopsy (1 site). I have discussed the findings and recommendations with the patient. The biopsy procedure was discussed with the patient and questions were answered. Patient expressed their understanding of the biopsy recommendation. Patient will be scheduled for biopsy at her earliest convenience by the schedulers. Ordering provider will be notified. If applicable, a reminder letter will be sent to the patient regarding the next appointment. BI-RADS CATEGORY  4: Suspicious. Electronically Signed   By: Sande Cromer M.D.   On: 03/09/2024 11:07

## 2024-03-28 NOTE — Assessment & Plan Note (Signed)
 Stage I right breast invasive carcinoma.  Pathology and radiology counseling: Discussed with the patient, the details of pathology including the type of breast cancer,the clinical staging, the significance of ER, PR and HER-2/neu receptors and the implications for treatment. After reviewing the pathology in detail, we proceeded to discuss the different treatment options between surgery, radiation, chemotherapy, antiestrogen therapies.  Treatment plan: 1.  Breast conserving surgery with sentinel lymph node biopsy 2. If final tumor size is >41mm, will send off Oncotype DX testing to determine if she would benefit from adjuvant chemo. If <= 5mm, will omit testing.  3.  Adjuvant radiation 4.  Adjuvant antiestrogen therapy  Return to clinic based upon surgery pathology and possible Oncotype DX test result

## 2024-03-29 ENCOUNTER — Other Ambulatory Visit: Payer: Self-pay | Admitting: General Surgery

## 2024-03-29 ENCOUNTER — Encounter: Payer: Self-pay | Admitting: Licensed Clinical Social Worker

## 2024-03-29 DIAGNOSIS — Z170 Estrogen receptor positive status [ER+]: Secondary | ICD-10-CM

## 2024-03-29 DIAGNOSIS — C50411 Malignant neoplasm of upper-outer quadrant of right female breast: Secondary | ICD-10-CM | POA: Diagnosis not present

## 2024-03-29 LAB — CANCER ANTIGEN 27.29: CA 27.29: 15 U/mL (ref 0.0–38.6)

## 2024-03-29 LAB — CANCER ANTIGEN 15-3: CA 15-3: 14.7 U/mL (ref 0.0–25.0)

## 2024-03-30 ENCOUNTER — Encounter: Payer: Self-pay | Admitting: *Deleted

## 2024-03-30 NOTE — Progress Notes (Signed)
 Lumpectomy is scheduled for 5/8.   She will see Dr. Wilhelmenia Harada on 5/22.

## 2024-03-31 ENCOUNTER — Encounter (HOSPITAL_BASED_OUTPATIENT_CLINIC_OR_DEPARTMENT_OTHER): Payer: Self-pay | Admitting: General Surgery

## 2024-03-31 ENCOUNTER — Other Ambulatory Visit: Payer: Self-pay

## 2024-04-05 ENCOUNTER — Ambulatory Visit
Admission: RE | Admit: 2024-04-05 | Discharge: 2024-04-05 | Disposition: A | Discharge status: Home / Self Care | Source: Ambulatory Visit | Attending: General Surgery | Admitting: General Surgery

## 2024-04-05 ENCOUNTER — Other Ambulatory Visit: Payer: Self-pay | Admitting: General Surgery

## 2024-04-05 ENCOUNTER — Encounter (HOSPITAL_BASED_OUTPATIENT_CLINIC_OR_DEPARTMENT_OTHER)
Admission: RE | Admit: 2024-04-05 | Discharge: 2024-04-05 | Disposition: A | Discharge status: Home / Self Care | Source: Ambulatory Visit | Attending: General Surgery | Admitting: General Surgery

## 2024-04-05 DIAGNOSIS — C50411 Malignant neoplasm of upper-outer quadrant of right female breast: Secondary | ICD-10-CM

## 2024-04-05 DIAGNOSIS — Z01818 Encounter for other preprocedural examination: Secondary | ICD-10-CM | POA: Diagnosis not present

## 2024-04-05 DIAGNOSIS — R92321 Mammographic fibroglandular density, right breast: Secondary | ICD-10-CM | POA: Diagnosis not present

## 2024-04-05 HISTORY — PX: BREAST BIOPSY: SHX20

## 2024-04-05 MED ORDER — CHLORHEXIDINE GLUCONATE CLOTH 2 % EX PADS: 6.0000 | MEDICATED_PAD | Freq: Once | CUTANEOUS | Status: DC

## 2024-04-05 MED ORDER — ENSURE PRE-SURGERY PO LIQD: 296.0000 mL | Freq: Once | ORAL | Status: DC

## 2024-04-05 NOTE — Progress Notes (Signed)

## 2024-04-05 NOTE — Anesthesia Preprocedure Evaluation (Signed)
 Anesthesia Evaluation  Patient identified by MRN, date of birth, ID band Patient awake    Reviewed: Allergy & Precautions, NPO status , Patient's Chart, lab work & pertinent test results  History of Anesthesia Complications Negative for: history of anesthetic complications  Airway Mallampati: III  TM Distance: >3 FB Neck ROM: Full    Dental  (+) Dental Advisory Given   Pulmonary neg pulmonary ROS   breath sounds clear to auscultation       Cardiovascular hypertension (losartan -HCTZ, nebivolol ), Pt. on medications and Pt. on home beta blockers (-) angina (-) Past MI, (-) Cardiac Stents and (-) CABG + dysrhythmias (1st degree AV block) + Valvular Problems/Murmurs (since childhood)  Rhythm:Regular Rate:Normal  HLD   Neuro/Psych negative neurological ROS     GI/Hepatic Neg liver ROS, hiatal hernia,GERD  ,,Lynch syndrome   Endo/Other  neg diabetes  Class 3 obesitythyroid tumor, no symptoms  Renal/GU negative Renal ROS     Musculoskeletal   Abdominal  (+) + obese  Peds  Hematology negative hematology ROS (+) Lab Results      Component                Value               Date                      WBC                      7.3                 03/28/2024                HGB                      13.4                03/28/2024                HCT                      40.2                03/28/2024                MCV                      88.0                03/28/2024                PLT                      253                 03/28/2024              Anesthesia Other Findings   Reproductive/Obstetrics Right breast cancer, H/o endometrial cancer                             Anesthesia Physical Anesthesia Plan  ASA: 3  Anesthesia Plan: General   Post-op Pain Management: Tylenol PO (pre-op)*   Induction: Intravenous  PONV Risk Score and Plan: 3 and Ondansetron, Dexamethasone, Propofol  infusion,  TIVA and Treatment may vary due to age or medical condition  Airway Management  Planned: LMA  Additional Equipment:   Intra-op Plan:   Post-operative Plan: Extubation in OR  Informed Consent: I have reviewed the patients History and Physical, chart, labs and discussed the procedure including the risks, benefits and alternatives for the proposed anesthesia with the patient or authorized representative who has indicated his/her understanding and acceptance.     Dental advisory given  Plan Discussed with: CRNA and Anesthesiologist  Anesthesia Plan Comments: (Risks of general anesthesia discussed including, but not limited to, sore throat, hoarse voice, chipped/damaged teeth, injury to vocal cords, nausea and vomiting, allergic reactions, lung infection, heart attack, stroke, and death. All questions answered. )        Anesthesia Quick Evaluation

## 2024-04-07 ENCOUNTER — Encounter (HOSPITAL_BASED_OUTPATIENT_CLINIC_OR_DEPARTMENT_OTHER)
Admission: RE | Disposition: A | Discharge status: Home / Self Care | Payer: Self-pay | Source: Home / Self Care | Attending: General Surgery

## 2024-04-07 ENCOUNTER — Ambulatory Visit
Admission: RE | Admit: 2024-04-07 | Discharge: 2024-04-07 | Disposition: A | Discharge status: Home / Self Care | Source: Ambulatory Visit | Attending: General Surgery | Admitting: General Surgery

## 2024-04-07 ENCOUNTER — Ambulatory Visit (HOSPITAL_BASED_OUTPATIENT_CLINIC_OR_DEPARTMENT_OTHER): Admitting: Anesthesiology

## 2024-04-07 ENCOUNTER — Ambulatory Visit (HOSPITAL_BASED_OUTPATIENT_CLINIC_OR_DEPARTMENT_OTHER)
Admission: RE | Admit: 2024-04-07 | Discharge: 2024-04-07 | Disposition: A | Discharge status: Home / Self Care | Attending: General Surgery | Admitting: General Surgery

## 2024-04-07 ENCOUNTER — Other Ambulatory Visit: Payer: Self-pay

## 2024-04-07 ENCOUNTER — Encounter (HOSPITAL_BASED_OUTPATIENT_CLINIC_OR_DEPARTMENT_OTHER): Payer: Self-pay | Admitting: General Surgery

## 2024-04-07 DIAGNOSIS — C50411 Malignant neoplasm of upper-outer quadrant of right female breast: Secondary | ICD-10-CM | POA: Insufficient documentation

## 2024-04-07 DIAGNOSIS — K219 Gastro-esophageal reflux disease without esophagitis: Secondary | ICD-10-CM | POA: Diagnosis not present

## 2024-04-07 DIAGNOSIS — E782 Mixed hyperlipidemia: Secondary | ICD-10-CM | POA: Diagnosis not present

## 2024-04-07 DIAGNOSIS — I10 Essential (primary) hypertension: Secondary | ICD-10-CM | POA: Diagnosis not present

## 2024-04-07 DIAGNOSIS — E785 Hyperlipidemia, unspecified: Secondary | ICD-10-CM | POA: Diagnosis not present

## 2024-04-07 DIAGNOSIS — Z8542 Personal history of malignant neoplasm of other parts of uterus: Secondary | ICD-10-CM | POA: Diagnosis not present

## 2024-04-07 DIAGNOSIS — Z170 Estrogen receptor positive status [ER+]: Secondary | ICD-10-CM | POA: Insufficient documentation

## 2024-04-07 DIAGNOSIS — Z1509 Genetic susceptibility to other malignant neoplasm: Secondary | ICD-10-CM | POA: Insufficient documentation

## 2024-04-07 DIAGNOSIS — C50911 Malignant neoplasm of unspecified site of right female breast: Secondary | ICD-10-CM

## 2024-04-07 DIAGNOSIS — Z6841 Body Mass Index (BMI) 40.0 and over, adult: Secondary | ICD-10-CM | POA: Insufficient documentation

## 2024-04-07 DIAGNOSIS — K449 Diaphragmatic hernia without obstruction or gangrene: Secondary | ICD-10-CM | POA: Diagnosis not present

## 2024-04-07 DIAGNOSIS — E66813 Obesity, class 3: Secondary | ICD-10-CM | POA: Diagnosis not present

## 2024-04-07 DIAGNOSIS — Z1732 Human epidermal growth factor receptor 2 negative status: Secondary | ICD-10-CM | POA: Insufficient documentation

## 2024-04-07 DIAGNOSIS — Z79899 Other long term (current) drug therapy: Secondary | ICD-10-CM | POA: Insufficient documentation

## 2024-04-07 DIAGNOSIS — Z1721 Progesterone receptor positive status: Secondary | ICD-10-CM | POA: Diagnosis not present

## 2024-04-07 HISTORY — PX: BREAST LUMPECTOMY WITH RADIOACTIVE SEED LOCALIZATION: SHX6424

## 2024-04-07 SURGERY — BREAST LUMPECTOMY WITH RADIOACTIVE SEED LOCALIZATION
Anesthesia: General | Site: Breast | Laterality: Right

## 2024-04-07 MED ORDER — BUPIVACAINE HCL (PF) 0.25 % IJ SOLN
INTRAMUSCULAR | Status: DC | PRN
  Administered 2024-04-07: 10 mL

## 2024-04-07 MED ORDER — BUPIVACAINE HCL (PF) 0.25 % IJ SOLN
INTRAMUSCULAR | Status: AC
  Filled 2024-04-07: qty 30

## 2024-04-07 MED ORDER — MIDAZOLAM HCL 2 MG/2ML IJ SOLN
INTRAMUSCULAR | Status: AC
  Filled 2024-04-07: qty 2

## 2024-04-07 MED ORDER — AMISULPRIDE (ANTIEMETIC) 5 MG/2ML IV SOLN: 10.0000 mg | Freq: Once | INTRAVENOUS | Status: DC | PRN

## 2024-04-07 MED ORDER — LIDOCAINE HCL (CARDIAC) PF 100 MG/5ML IV SOSY
PREFILLED_SYRINGE | INTRAVENOUS | Status: DC | PRN
  Administered 2024-04-07: 100 mg via INTRAVENOUS

## 2024-04-07 MED ORDER — OXYCODONE HCL 5 MG PO TABS
ORAL_TABLET | ORAL | Status: AC
  Filled 2024-04-07: qty 1

## 2024-04-07 MED ORDER — FENTANYL CITRATE (PF) 100 MCG/2ML IJ SOLN
25.0000 ug | INTRAMUSCULAR | Status: DC | PRN
  Administered 2024-04-07: 50 ug via INTRAVENOUS

## 2024-04-07 MED ORDER — PROPOFOL 500 MG/50ML IV EMUL
INTRAVENOUS | Status: AC
  Filled 2024-04-07: qty 50

## 2024-04-07 MED ORDER — FENTANYL CITRATE (PF) 100 MCG/2ML IJ SOLN
INTRAMUSCULAR | Status: AC
Start: 2024-04-07 — End: ?
  Filled 2024-04-07: qty 2

## 2024-04-07 MED ORDER — PHENYLEPHRINE HCL (PRESSORS) 10 MG/ML IV SOLN
INTRAVENOUS | Status: DC | PRN
  Administered 2024-04-07: 80 ug via INTRAVENOUS

## 2024-04-07 MED ORDER — LIDOCAINE 2% (20 MG/ML) 5 ML SYRINGE
INTRAMUSCULAR | Status: AC
  Filled 2024-04-07: qty 5

## 2024-04-07 MED ORDER — PROPOFOL 10 MG/ML IV BOLUS
INTRAVENOUS | Status: DC | PRN
  Administered 2024-04-07: 200 mg via INTRAVENOUS
  Administered 2024-04-07: 50 mg via INTRAVENOUS

## 2024-04-07 MED ORDER — CIPROFLOXACIN IN D5W 400 MG/200ML IV SOLN
INTRAVENOUS | Status: AC
  Filled 2024-04-07: qty 200

## 2024-04-07 MED ORDER — DEXAMETHASONE SODIUM PHOSPHATE 10 MG/ML IJ SOLN
INTRAMUSCULAR | Status: AC
  Filled 2024-04-07: qty 1

## 2024-04-07 MED ORDER — CIPROFLOXACIN IN D5W 400 MG/200ML IV SOLN
400.0000 mg | INTRAVENOUS | Status: AC
  Administered 2024-04-07: 400 mg via INTRAVENOUS

## 2024-04-07 MED ORDER — FENTANYL CITRATE (PF) 100 MCG/2ML IJ SOLN
INTRAMUSCULAR | Status: AC
  Filled 2024-04-07: qty 2

## 2024-04-07 MED ORDER — ONDANSETRON HCL 4 MG/2ML IJ SOLN
INTRAMUSCULAR | Status: AC
  Filled 2024-04-07: qty 2

## 2024-04-07 MED ORDER — MIDAZOLAM HCL 5 MG/5ML IJ SOLN
INTRAMUSCULAR | Status: DC | PRN
  Administered 2024-04-07: 2 mg via INTRAVENOUS

## 2024-04-07 MED ORDER — DEXAMETHASONE SODIUM PHOSPHATE 4 MG/ML IJ SOLN
INTRAMUSCULAR | Status: DC | PRN
  Administered 2024-04-07: 10 mg via INTRAVENOUS

## 2024-04-07 MED ORDER — ONDANSETRON HCL 4 MG/2ML IJ SOLN
INTRAMUSCULAR | Status: DC | PRN
  Administered 2024-04-07: 4 mg via INTRAVENOUS

## 2024-04-07 MED ORDER — ACETAMINOPHEN 500 MG PO TABS
1000.0000 mg | ORAL_TABLET | ORAL | Status: AC
  Administered 2024-04-07: 1000 mg via ORAL

## 2024-04-07 MED ORDER — OXYCODONE HCL 5 MG PO TABS
5.0000 mg | ORAL_TABLET | Freq: Once | ORAL | Status: AC | PRN
  Administered 2024-04-07: 5 mg via ORAL

## 2024-04-07 MED ORDER — CIPROFLOXACIN IN D5W 400 MG/200ML IV SOLN
INTRAVENOUS | Status: DC | PRN
  Administered 2024-04-07: 400 mg via INTRAVENOUS

## 2024-04-07 MED ORDER — LACTATED RINGERS IV SOLN: INTRAVENOUS | Status: DC

## 2024-04-07 MED ORDER — ACETAMINOPHEN 500 MG PO TABS
ORAL_TABLET | ORAL | Status: AC
  Filled 2024-04-07: qty 2

## 2024-04-07 MED ORDER — FENTANYL CITRATE (PF) 100 MCG/2ML IJ SOLN
INTRAMUSCULAR | Status: DC | PRN
  Administered 2024-04-07 (×2): 50 ug via INTRAVENOUS

## 2024-04-07 MED ORDER — OXYCODONE HCL 5 MG/5ML PO SOLN: 5.0000 mg | Freq: Once | ORAL | Status: AC | PRN

## 2024-04-07 MED ORDER — PROPOFOL 500 MG/50ML IV EMUL
INTRAVENOUS | Status: DC | PRN
  Administered 2024-04-07: 150 ug/kg/min via INTRAVENOUS

## 2024-04-07 SURGICAL SUPPLY — 46 items
BINDER BREAST LRG (GAUZE/BANDAGES/DRESSINGS) IMPLANT
BINDER BREAST MEDIUM (GAUZE/BANDAGES/DRESSINGS) IMPLANT
BINDER BREAST XLRG (GAUZE/BANDAGES/DRESSINGS) IMPLANT
BINDER BREAST XXLRG (GAUZE/BANDAGES/DRESSINGS) IMPLANT
BLADE SURG 15 STRL LF DISP TIS (BLADE) ×1 IMPLANT
CANISTER SUC SOCK COL 7IN (MISCELLANEOUS) IMPLANT
CANISTER SUCT 1200ML W/VALVE (MISCELLANEOUS) IMPLANT
CHLORAPREP W/TINT 26 (MISCELLANEOUS) ×1 IMPLANT
CLIP APPLIE 9.375 MED OPEN (MISCELLANEOUS) IMPLANT
CLIP TI WIDE RED SMALL 6 (CLIP) IMPLANT
COVER BACK TABLE 60X90IN (DRAPES) ×1 IMPLANT
COVER MAYO STAND STRL (DRAPES) ×1 IMPLANT
COVER PROBE CYLINDRICAL 5X96 (MISCELLANEOUS) ×1 IMPLANT
DERMABOND ADVANCED .7 DNX12 (GAUZE/BANDAGES/DRESSINGS) ×1 IMPLANT
DRAPE LAPAROSCOPIC ABDOMINAL (DRAPES) ×1 IMPLANT
DRAPE UTILITY XL STRL (DRAPES) ×1 IMPLANT
DRSG TEGADERM 4X4.75 (GAUZE/BANDAGES/DRESSINGS) IMPLANT
ELECT COATED BLADE 2.86 ST (ELECTRODE) ×1 IMPLANT
ELECTRODE REM PT RTRN 9FT ADLT (ELECTROSURGICAL) ×1 IMPLANT
GAUZE SPONGE 4X4 12PLY STRL LF (GAUZE/BANDAGES/DRESSINGS) IMPLANT
GLOVE BIO SURGEON STRL SZ7 (GLOVE) ×2 IMPLANT
GLOVE BIOGEL PI IND STRL 7.5 (GLOVE) ×1 IMPLANT
GOWN STRL REUS W/ TWL LRG LVL3 (GOWN DISPOSABLE) ×2 IMPLANT
HEMOSTAT ARISTA ABSORB 3G PWDR (HEMOSTASIS) IMPLANT
KIT MARKER MARGIN INK (KITS) ×1 IMPLANT
NDL HYPO 25X1 1.5 SAFETY (NEEDLE) ×1 IMPLANT
NEEDLE HYPO 25X1 1.5 SAFETY (NEEDLE) ×1 IMPLANT
NS IRRIG 1000ML POUR BTL (IV SOLUTION) IMPLANT
PACK BASIN DAY SURGERY FS (CUSTOM PROCEDURE TRAY) ×1 IMPLANT
PENCIL SMOKE EVACUATOR (MISCELLANEOUS) ×1 IMPLANT
RETRACTOR ONETRAX LX 90X20 (MISCELLANEOUS) IMPLANT
SLEEVE SCD COMPRESS KNEE MED (STOCKING) ×1 IMPLANT
SPIKE FLUID TRANSFER (MISCELLANEOUS) IMPLANT
SPONGE T-LAP 4X18 ~~LOC~~+RFID (SPONGE) ×1 IMPLANT
STRIP CLOSURE SKIN 1/2X4 (GAUZE/BANDAGES/DRESSINGS) ×1 IMPLANT
SUT MNCRL AB 4-0 PS2 18 (SUTURE) ×1 IMPLANT
SUT MON AB 5-0 PS2 18 (SUTURE) IMPLANT
SUT SILK 2 0 SH (SUTURE) IMPLANT
SUT VIC AB 2-0 SH 27XBRD (SUTURE) ×1 IMPLANT
SUT VIC AB 3-0 SH 27X BRD (SUTURE) ×1 IMPLANT
SUT VIC AB 5-0 PS2 18 (SUTURE) IMPLANT
SYR CONTROL 10ML LL (SYRINGE) ×1 IMPLANT
TOWEL GREEN STERILE FF (TOWEL DISPOSABLE) ×1 IMPLANT
TRAY FAXITRON CT DISP (TRAY / TRAY PROCEDURE) ×1 IMPLANT
TUBE CONNECTING 20X1/4 (TUBING) IMPLANT
YANKAUER SUCT BULB TIP NO VENT (SUCTIONS) IMPLANT

## 2024-04-07 NOTE — Anesthesia Postprocedure Evaluation (Signed)
 Anesthesia Post Note  Patient: Pamela Reid  Procedure(s) Performed: BREAST LUMPECTOMY WITH RADIOACTIVE SEED LOCALIZATION (Right: Breast)     Patient location during evaluation: PACU Anesthesia Type: General Level of consciousness: awake Pain management: pain level controlled Vital Signs Assessment: post-procedure vital signs reviewed and stable Respiratory status: spontaneous breathing, nonlabored ventilation and respiratory function stable Cardiovascular status: blood pressure returned to baseline and stable Postop Assessment: no apparent nausea or vomiting Anesthetic complications: no   No notable events documented.  Last Vitals:  Vitals:   04/07/24 0915 04/07/24 0938  BP: (!) 142/73 (!) 145/70  Pulse: 63 67  Resp: (!) 21 16  Temp:  (!) 36.1 C  SpO2: 96% 95%    Last Pain:  Vitals:   04/07/24 0938  TempSrc:   PainSc: 3                  Conard Decent

## 2024-04-07 NOTE — Transfer of Care (Signed)
 Immediate Anesthesia Transfer of Care Note  Patient: Pamela Reid  Procedure(s) Performed: BREAST LUMPECTOMY WITH RADIOACTIVE SEED LOCALIZATION (Right: Breast)  Patient Location: PACU  Anesthesia Type:General  Level of Consciousness: awake, alert , and oriented  Airway & Oxygen Therapy: Patient Spontanous Breathing and Patient connected to face mask oxygen  Post-op Assessment: Report given to RN and Post -op Vital signs reviewed and stable  Post vital signs: Reviewed and stable  Last Vitals:  Vitals Value Taken Time  BP    Temp    Pulse 74 04/07/24 0905  Resp 18 04/07/24 0905  SpO2 99 % 04/07/24 0905  Vitals shown include unfiled device data.  Last Pain:  Vitals:   04/07/24 1610  TempSrc:   PainSc: 0-No pain      Patients Stated Pain Goal: 5 (04/07/24 0701)  Complications: No notable events documented.

## 2024-04-07 NOTE — Discharge Instructions (Addendum)
 No tylenol until 1:15 p.m.  Post Anesthesia Home Care Instructions  Activity: Get plenty of rest for the remainder of the day. A responsible individual must stay with you for 24 hours following the procedure.  For the next 24 hours, DO NOT: -Drive a car -Advertising copywriter -Drink alcoholic beverages -Take any medication unless instructed by your physician -Make any legal decisions or sign important papers.  Meals: Start with liquid foods such as gelatin or soup. Progress to regular foods as tolerated. Avoid greasy, spicy, heavy foods. If nausea and/or vomiting occur, drink only clear liquids until the nausea and/or vomiting subsides. Call your physician if vomiting continues.  Special Instructions/Symptoms: Your throat may feel dry or sore from the anesthesia or the breathing tube placed in your throat during surgery. If this causes discomfort, gargle with warm salt water. The discomfort should disappear within 24 hours.  If you had a scopolamine patch placed behind your ear for the management of post- operative nausea and/or vomiting:  1. The medication in the patch is effective for 72 hours, after which it should be removed.  Wrap patch in a tissue and discard in the trash. Wash hands thoroughly with soap and water. 2. You may remove the patch earlier than 72 hours if you experience unpleasant side effects which may include dry mouth, dizziness or visual disturbances. 3. Avoid touching the patch. Wash your hands with soap and water after contact with the patch.     [facility] Office Phone Number [phone]  POST OP INSTRUCTIONS Take 400 mg of ibuprofen every 8 hours or 650 mg tylenol every 6 hours for next 72 hours then as needed. Use ice several times daily also.  A prescription for pain medication may be given to you upon discharge.  Take your pain medication as prescribed, if needed.  If narcotic pain medicine is not needed, then you may take acetaminophen  (Tylenol), naprosyn (Alleve) or ibuprofen (Advil) as needed. Take your usually prescribed medications unless otherwise directed If you need a refill on your pain medication, please contact your pharmacy.  They will contact our office to request authorization.  Prescriptions will not be filled after 5pm or on week-ends. You should eat very light the first 24 hours after surgery, such as soup, crackers, pudding, etc.  Resume your normal diet the day after surgery. Most patients will experience some swelling and bruising in the breast.  Ice packs and a good support bra will help.  Wear the breast binder provided or a sports bra for 72 hours day and night.  After that wear a sports bra during the day until you return to the office. Swelling and bruising can take several days to resolve.  It is common to experience some constipation if taking pain medication after surgery.  Increasing fluid intake and taking a stool softener will usually help or prevent this problem from occurring.  A mild laxative (Milk of Magnesia or Miralax) should be taken according to package directions if there are no bowel movements after 48 hours. I used skin glue on the incision, you may shower in 24 hours.  The glue will flake off over the next 2-3 weeks.  Any sutures or staples will be removed at the office during your follow-up visit. ACTIVITIES:  You may resume regular daily activities (gradually increasing) beginning the next day.  Wearing a good support bra or sports bra minimizes pain and swelling.  You may have sexual intercourse when it is comfortable. You may drive  when you no longer are taking prescription pain medication, you can comfortably wear a seatbelt, and you can safely maneuver your car and apply brakes. RETURN TO WORK:  ______________________________________________________________________________________ Pamela Reid should see your doctor in the office for a follow-up appointment approximately two weeks after your  surgery.  Your doctor's nurse will typically make your follow-up appointment when she calls you with your pathology report.  Expect your pathology report 3-4 business days after your surgery.  You may call to check if you do not hear from us  after three days. OTHER INSTRUCTIONS: _______________________________________________________________________________________________ _____________________________________________________________________________________________________________________________________ _____________________________________________________________________________________________________________________________________ _____________________________________________________________________________________________________________________________________  WHEN TO CALL DR WAKEFIELD: Fever over 101.0 Nausea and/or vomiting. Extreme swelling or bruising. Continued bleeding from incision. Increased pain, redness, or drainage from the incision.  The clinic staff is available to answer your questions during regular business hours.  Please don't hesitate to call and ask to speak to one of the nurses for clinical concerns.  If you have a medical emergency, go to the nearest emergency room or call 911.  A surgeon from [facility] is always on call at the hospital.  For further questions, please visit [email or web address]

## 2024-04-07 NOTE — Interval H&P Note (Signed)
 History and Physical Interval Note:  04/07/2024 7:34 AM  Pamela Reid  has presented today for surgery, with the diagnosis of RIGHT BREAST CANCER.  The various methods of treatment have been discussed with the patient and family. After consideration of risks, benefits and other options for treatment, the patient has consented to  Procedure(s): BREAST LUMPECTOMY WITH RADIOACTIVE SEED LOCALIZATION (Right) as a surgical intervention.  The patient's history has been reviewed, patient examined, no change in status, stable for surgery.  I have reviewed the patient's chart and labs.  Questions were answered to the patient's satisfaction.     Enid Harry

## 2024-04-07 NOTE — Op Note (Signed)
 Preoperative diagnosis: Clinical stage I right breast cancer Postoperative diagnosis: Same as above Procedure: Right breast radioactive seed guided lumpectomy Surgeon: Dr. Donavan Fuchs Estimated blood loss: Minimal Anesthesia: General Complications: None Drains: None Specimens: None 1.  Right breast tissue containing seed and clip marked with paint 2.  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 She does have Lynch syndrome and is up-to-date on all of follow-up for that. She underwent a screening mammogram that shows B density breast tissue. She had a right sided distortion as well as some calcifications noted. On diagnostic imaging there was a 3 mm mass at 9 mm of calcifications. Ultrasound showed a 5 x 4 x 4 mm mass at the site of the distortion. There was no lymphadenopathy. She underwent a biopsy of the calcifications which is benign breast tissue with fibroadenomatous changes and calcifications that is concordant. Biopsy of the mass is a grade 1 invasive ductal carcinoma that is 100% ER positive, 40% PR positive, HER2 negative, and the proliferation index is 7% we discussed proceeding with a lumpectomy alone.  Procedure: After informed consent was obtained she first had a seed placed.  I discussed this with radiology.  She had 1 seed placed that pulled back in the tract so this is separate.  A second seed was placed at the site of the cancer.  I had the mammograms available for my review in the operating room.  She was placed under general anesthesia without complication.  She was given antibiotics.  SCDs were in place.  She was prepped and draped in a standard sterile surgical fashion.  Surgical timeout was then performed.  I ended up making a curvilinear incision overlying the seed that had backed up just so I could get this  out safely.  I infiltrated Marcaine throughout the area.  I then was able to remove the seed just by itself without any tissue.  We did take an image to confirm removal and maintain control.  I then was able to dissect to the seed in the cancer.  I used the neoprobe to remove the seed in the surrounding tissue with an attempt to get a clear margin.  Mammogram confirmed removal of the seed and the clip.  The 3D images look like all my margins were clear.  I then obtained hemostasis.  I closed the cavity with 2-0 Vicryl.  The skin was closed with 3-0 Vicryl and 4 Monocryl.  Glue and Steri-Strips were applied.  She tolerated this well was extubated and transferred to recovery stable.

## 2024-04-07 NOTE — H&P (Signed)
 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 She does have Lynch syndrome and is up-to-date on all of follow-up for that. She underwent a screening mammogram that shows B density breast tissue. She had a right sided distortion as well as some calcifications noted. On diagnostic imaging there was a 3 mm mass at 9 mm of calcifications. Ultrasound showed a 5 x 4 x 4 mm mass at the site of the distortion. There was no lymphadenopathy. She underwent a biopsy of the calcifications which is benign breast tissue with fibroadenomatous changes and calcifications that is concordant. Biopsy of the mass is a grade 1 invasive ductal carcinoma that is 100% ER positive, 40% PR positive, HER2 negative, and the proliferation index is 7%. She is here to discuss all of her options.  Review of Systems: A complete review of systems was obtained from the patient. I have reviewed this information and discussed as appropriate with the patient. See HPI as well for other ROS.  Review of Systems  All other systems reviewed and are negative.   Medical History: Past Medical History:  Diagnosis Date  GERD (gastroesophageal reflux disease)  History of cancer  Hyperlipidemia  Hypertension    Past Surgical History:  Procedure Laterality Date  HERNIA REPAIR  HYSTERECTOMY   Allergies  Allergen Reactions  Cephalexin Hives and Rash Penicillins Hives and Rash   Current Outpatient Medications on File Prior to Visit  Medication Sig Dispense Refill  aspirin 325 MG EC tablet Take 325 mg by mouth once daily  calcium  polycarbophiL (FIBERCON) 625 mg tablet Take by mouth  cholecalciferol, vitamin D3, 75 mcg (3,000 unit) Tab Take by mouth  co-enzyme Q-10, ubiquinone, 30 mg capsule Take by mouth  esomeprazole (NEXIUM) 20 MG DR capsule Take 10 mg by mouth once daily  losartan -hydroCHLOROthiazide (HYZAAR) 100-25 mg tablet Take 1 tablet by mouth once daily  multivitamin tablet Take 1 tablet by mouth  once daily  nebivoloL  (BYSTOLIC ) 2.5 MG tablet TAKE 1 TABLET DAILY BY MOUTH FOR HYPERTENSION  rosuvastatin  (CRESTOR ) 10 MG tablet   History reviewed. No pertinent family history.   Social History   Tobacco Use  Smoking Status Never  Smokeless Tobacco Never  Marital status: Married  Tobacco Use  Smoking status: Never  Smokeless tobacco: Never  Substance and Sexual Activity  Alcohol use: Yes  Drug use: Not Currently    Objective:   Body mass index is 44.99 kg/m.  Physical Exam Vitals reviewed.  Constitutional:  Appearance: Normal appearance.  Chest:  Breasts: Right: No inverted nipple, mass or nipple discharge.  Left: No inverted nipple, mass or nipple discharge.  Lymphadenopathy:  Upper Body:  Right upper body: No supraclavicular or axillary adenopathy.  Left upper body: No supraclavicular or axillary adenopathy.  Neurological:  Mental Status: She is alert.    Assessment and Plan:   Malignant neoplasm of upper-outer quadrant of right breast in female, estrogen receptor positive (CMS/HHS-HCC)  Right breast seed guided lumpectomy  We discussed the staging and pathophysiology of breast cancer. We discussed all of the different options for treatment for breast cancer including surgery, chemotherapy, radiation therapy, and antiestrogen therapy.  We discussed a sentinel lymph node biopsy today. She is almost 69 with small, grade I er pos tumor. Locally we have discussed INSEMA and SOUND trials and she fits in category we have elected to omit sn biopsy. We discussed this today and will plan to omit.   We discussed the options for treatment of  the breast cancer which included lumpectomy versus a mastectomy. We discussed the performance of the lumpectomy with radioactive seed placement. We discussed a 5-10% chance of a positive margin requiring reexcision in the operating room. We also discussed that she will likely need radiation therapy if she undergoes lumpectomy. We  discussed mastectomy and the postoperative care for that as well. Mastectomy can be followed by reconstruction. The decision for lumpectomy vs mastectomy has no impact on decision for chemotherapy. We discussed oncotype for decision for chemotherapy if indicated after surgery. Most mastectomy patients will not need radiation therapy. We discussed that there is no difference in her survival whether she undergoes lumpectomy with radiation therapy or antiestrogen therapy versus a mastectomy. There is also no real difference between her recurrence in the breast.  We discussed the risks of operation including bleeding, infection, possible reoperation. She understands her further therapy will be based on what her stages at the time of her operation.

## 2024-04-07 NOTE — Anesthesia Procedure Notes (Signed)
 Procedure Name: LMA Insertion Date/Time: 04/07/2024 8:02 AM  Performed by: Arvilla Birmingham, CRNAPre-anesthesia Checklist: Patient identified, Emergency Drugs available, Suction available and Patient being monitored Patient Re-evaluated:Patient Re-evaluated prior to induction Oxygen Delivery Method: Circle system utilized Preoxygenation: Pre-oxygenation with 100% oxygen Induction Type: IV induction Ventilation: Mask ventilation without difficulty LMA: LMA inserted LMA Size: 4.0 Number of attempts: 1 Airway Equipment and Method: Bite block Placement Confirmation: positive ETCO2 Tube secured with: Tape Dental Injury: Teeth and Oropharynx as per pre-operative assessment

## 2024-04-08 ENCOUNTER — Encounter (HOSPITAL_BASED_OUTPATIENT_CLINIC_OR_DEPARTMENT_OTHER): Payer: Self-pay | Admitting: General Surgery

## 2024-04-11 LAB — SURGICAL PATHOLOGY

## 2024-04-12 ENCOUNTER — Encounter: Payer: Self-pay | Admitting: *Deleted

## 2024-04-12 NOTE — Progress Notes (Signed)
 Oncotype Dx order [id number] submitted online.

## 2024-04-20 ENCOUNTER — Encounter: Payer: Self-pay | Admitting: *Deleted

## 2024-04-20 ENCOUNTER — Inpatient Hospital Stay: Attending: Oncology

## 2024-04-20 DIAGNOSIS — C50919 Malignant neoplasm of unspecified site of unspecified female breast: Secondary | ICD-10-CM

## 2024-04-20 NOTE — Progress Notes (Signed)
 Oncotype is pending, exact sciences had to request more tissue to complete testing.   The appt. With Dr. Wilhelmenia Harada has been rescheduled to 6/3.  She will also see Dr. Jacalyn Martin that day.   Appt. Details given to her.

## 2024-04-20 NOTE — Progress Notes (Unsigned)
 Multidisciplinary Oncology Council Documentation  Pamela Reid was presented by our [facility] on 04/20/2024, which included representatives from:  Palliative Care Dietitian  Physical/Occupational Therapist Nurse Navigator Genetics Social work Survivorship RN Financial Navigator Research RN   [place] currently presents with history of breast cancer  We reviewed previous medical and familial history, history of present illness, and recent lab results along with all available histopathologic and imaging studies. The MOC considered available treatment options and made the following recommendations/referrals:    The MOC is a meeting of clinicians from various specialty areas who evaluate and discuss patients for whom a multidisciplinary approach is being considered. Final determinations in the plan of care are those of the provider(s).   Today's extended care, comprehensive team conference, Pamela Reid was not present for the discussion and was not examined.

## 2024-04-21 ENCOUNTER — Inpatient Hospital Stay: Admitting: Oncology

## 2024-04-22 ENCOUNTER — Encounter (HOSPITAL_COMMUNITY): Payer: Self-pay

## 2024-04-22 DIAGNOSIS — Z170 Estrogen receptor positive status [ER+]: Secondary | ICD-10-CM | POA: Diagnosis not present

## 2024-04-22 DIAGNOSIS — C50411 Malignant neoplasm of upper-outer quadrant of right female breast: Secondary | ICD-10-CM | POA: Diagnosis not present

## 2024-05-03 ENCOUNTER — Encounter: Payer: Self-pay | Admitting: Oncology

## 2024-05-03 ENCOUNTER — Ambulatory Visit
Admission: RE | Admit: 2024-05-03 | Discharge: 2024-05-03 | Disposition: A | Discharge status: Home / Self Care | Source: Ambulatory Visit | Attending: Radiation Oncology | Admitting: Radiation Oncology

## 2024-05-03 ENCOUNTER — Inpatient Hospital Stay: Admitting: Oncology

## 2024-05-03 VITALS — BP 146/56 | HR 67 | Temp 97.0°F | Resp 16 | Wt 256.0 lb

## 2024-05-03 DIAGNOSIS — Z8542 Personal history of malignant neoplasm of other parts of uterus: Secondary | ICD-10-CM | POA: Insufficient documentation

## 2024-05-03 DIAGNOSIS — Z1721 Progesterone receptor positive status: Secondary | ICD-10-CM | POA: Insufficient documentation

## 2024-05-03 DIAGNOSIS — C50411 Malignant neoplasm of upper-outer quadrant of right female breast: Secondary | ICD-10-CM | POA: Insufficient documentation

## 2024-05-03 DIAGNOSIS — Z1509 Genetic susceptibility to other malignant neoplasm: Secondary | ICD-10-CM | POA: Diagnosis not present

## 2024-05-03 DIAGNOSIS — C50919 Malignant neoplasm of unspecified site of unspecified female breast: Secondary | ICD-10-CM

## 2024-05-03 DIAGNOSIS — Z170 Estrogen receptor positive status [ER+]: Secondary | ICD-10-CM | POA: Insufficient documentation

## 2024-05-03 DIAGNOSIS — Z803 Family history of malignant neoplasm of breast: Secondary | ICD-10-CM | POA: Insufficient documentation

## 2024-05-03 DIAGNOSIS — Z800 Family history of malignant neoplasm of digestive organs: Secondary | ICD-10-CM | POA: Insufficient documentation

## 2024-05-03 DIAGNOSIS — Z90710 Acquired absence of both cervix and uterus: Secondary | ICD-10-CM | POA: Insufficient documentation

## 2024-05-03 DIAGNOSIS — Z510 Encounter for antineoplastic radiation therapy: Secondary | ICD-10-CM | POA: Insufficient documentation

## 2024-05-03 DIAGNOSIS — Z1732 Human epidermal growth factor receptor 2 negative status: Secondary | ICD-10-CM | POA: Insufficient documentation

## 2024-05-03 NOTE — Progress Notes (Signed)
 Hematology/Oncology Progress note [phone] [phone]        REFERRING PROVIDER: Lemar Pyles, NP    CHIEF COMPLAINTS/PURPOSE OF CONSULTATION:  Stage I right breast invasive carcinoma  ASSESSMENT & PLAN:   Invasive carcinoma of breast (HCC) Stage I right breast invasive carcinoma. pT1b pNx, ER 100%, PR 40%, HER2 low (1+) Ki67 7% Pathology results were reviewed with patient.  Oncotype Dx 20, <1% chemotherapy benefit. Will not offer adjuvant chemotherapy.  Recommend adjuvant radiation.  After that, recommend AI for 5 years.    PMS2-related Lynch syndrome (HNPCC4) Association between Lynch syndrome to breast cancer is unknown. Discussed with genetic counselor she does not need additional genetic testing.    Orders Placed This Encounter  Procedures   DG Bone Density    Standing Status:   Future    Expected Date:   05/10/2024    Expiration Date:   05/03/2025    Reason for Exam (SYMPTOM  OR DIAGNOSIS REQUIRED):   breast cancer    Preferred imaging location?:   [facility]   Follow-up to be determined. All questions were answered. The patient knows to call the clinic with any problems, questions or concerns.  Timmy Forbes, MD, PhD [facility] Hematology Oncology 05/03/2024    HISTORY OF PRESENTING ILLNESS:  Pamela Reid 68 y.o. female presents to establish care for stage I right breast invasive ductal carcinoma. I have reviewed her chart and materials related to her cancer extensively and collaborated history with the patient. Summary of oncologic history is as follows: Oncology History  Invasive carcinoma of breast (HCC)  02/23/2024 Mammogram   Bilateral screening mammogram  In the right breast, possible distortion and separate calcifications warrant further evaluation. In the left breast, no findings suspicious for malignancy.   03/09/2024 Mammogram   Bilateral diagnostic mammogram w US  1. RIGHT breast 5 mm spiculated mass in the 11  o'clock position is intermediate suspicion for malignancy. Recommend further assessment with ultrasound-guided biopsy. 2. RIGHT breast 9 mm group of coarse heterogeneous calcification is low suspicion for malignancy. Recommend further assessment with stereotactic guided biopsy.  Targeted right axillary ultrasound demonstrates multiple morphologically benign lymph nodes. No lymphadenopathy   03/28/2024 Initial Diagnosis   Invasive carcinoma of breast [facility])  Patient underwent right breast biopsy.  1. Breast, right, needle core biopsy, 11 o'clock (coil) :       INVASIVE DUCTAL CARCINOMA       TUBULE FORMATION: SCORE 2       NUCLEAR PLEOMORPHISM: SCORE 2       MITOTIC COUNT: SCORE 1       TOTAL SCORE: 5       OVERALL GRADE: 1       LYMPHOVASCULAR INVASION: NOT IDENTIFIED       CANCER LENGTH: 5 MM       CALCIFICATIONS: PRESENT       OTHER FINDINGS: NONE       ER 100%+, PR 40%+ HER2 low (1+)        2. Breast, right, needle core biopsy, upper outer quadrant (ribbon) :       BENIGN BREAST TISSUE WITH FIBROADENOMATOUS CHANGES AND CALCIFICATIONS.       NEGATIVE FOR ATYPIA OR MALIGNANCY.      03/28/2024 Cancer Staging   Staging form: Breast, AJCC 8th Edition - Clinical stage from 03/28/2024: Stage IA (cT1a, cN0, cM0, G1, ER+, PR+, HER2-) - Signed by Timmy Forbes, MD on 03/28/2024 Stage prefix: Initial diagnosis Histologic grading system: 3 grade  system    Surgery     Patient has a history of early-stage uterine cancer status post total hysterectomy.  Strong family history of GI cancer.  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 Positive for family history of breast cancer.  Patient presents for postsurgery follow-up.  She reports feeling well with no concerns of surgical sites.  She has an appointment with radiation oncology for evaluation.  MEDICAL HISTORY:  Past Medical History:  Diagnosis Date   Allergy    Cancer [facility])    Family  history of breast cancer    Family history of melanoma    GERD (gastroesophageal reflux disease)    Heart murmur as a child - mitral valve   Hypertension    Lynch syndrome    Thyroid  disease Thyroid  biopsy for tumor 2013    SURGICAL HISTORY: Past Surgical History:  Procedure Laterality Date   ABDOMINAL HYSTERECTOMY     BREAST BIOPSY Left 2019   stereotatic bx neg   BREAST BIOPSY Right 03/22/2024   Stereo bx, ribbon clip, path pending   BREAST BIOPSY Right 03/22/2024   MM RT BREAST BX W LOC DEV 1ST LESION IMAGE BX SPEC STEREO GUIDE 03/22/2024 [facility]   BREAST BIOPSY Right 03/22/2024   US  RT BREAST BX W LOC DEV 1ST LESION IMG BX SPEC US  GUIDE 03/22/2024 [facility]   BREAST BIOPSY  04/05/2024   US  RT RADIOACTIVE SEED LOC 04/05/2024 [facility]   BREAST BIOPSY Right 04/05/2024   US  RT RADIOACTIVE SEED EA ADD LESION 04/05/2024 [facility]   BREAST LUMPECTOMY WITH RADIOACTIVE SEED LOCALIZATION Right 04/07/2024   Procedure: BREAST LUMPECTOMY WITH RADIOACTIVE SEED LOCALIZATION;  Surgeon: Enid Harry, MD;  Location: [facility];  Service: General;  Laterality: Right;   CHOLECYSTECTOMY     COLON SURGERY  2017   COLONOSCOPY WITH PROPOFOL  N/A 09/11/2021   Procedure: COLONOSCOPY WITH PROPOFOL ;  Surgeon: Selena Daily, MD;  Location: [facility];  Service: Gastroenterology;  Laterality: N/A;   TUBAL LIGATION  1986    SOCIAL HISTORY: Social History   Socioeconomic History   Marital status: Married    Spouse name: Lavonia Powers   Number of children: 2   Years of education: Not on file   Highest education level: Doctorate  Occupational History   Occupation: part time Catering manager & teaching  Tobacco Use   Smoking status: Never   Smokeless tobacco: Never  Vaping Use   Vaping status: Never Used  Substance and Sexual Activity   Alcohol use: Yes    Alcohol/week: 1.0 standard drink of alcohol    Types: 1 Glasses of wine per week    Comment:  occasional   Drug use: Never   Sexual activity: Yes    Birth control/protection: Post-menopausal  Other Topics Concern   Not on file  Social History Narrative   Married, 2 children and 7 grandchildren   Social Drivers of Corporate investment banker Strain: Low Risk  (02/15/2024)   Overall Financial Resource Strain (CARDIA)    Difficulty of Paying Living Expenses: Not hard at all  Food Insecurity: No Food Insecurity (02/15/2024)   Hunger Vital Sign    Worried About Running Out of Food in the Last Year: Never true    Ran Out of Food in the Last Year: Never true  Transportation Needs: No Transportation Needs (02/15/2024)   PRAPARE - Administrator, Civil Service (Medical): No    Lack of Transportation (  Non-Medical): No  Physical Activity: Sufficiently Active (02/15/2024)   Exercise Vital Sign    Days of Exercise per Week: 4 days    Minutes of Exercise per Session: 60 min  Stress: No Stress Concern Present (02/15/2024)   [employer] of Occupational Health - Occupational Stress Questionnaire    Feeling of Stress : Only a little  Social Connections: Moderately Integrated (02/15/2024)   Social Connection and Isolation Panel [NHANES]    Frequency of Communication with Friends and Family: More than three times a week    Frequency of Social Gatherings with Friends and Family: More than three times a week    Attends Religious Services: Never    Database administrator or Organizations: No    Attends Engineer, structural: More than 4 times per year    Marital Status: Married  Catering manager Violence: Not At Risk (07/14/2023)   Humiliation, Afraid, Rape, and Kick questionnaire    Fear of Current or Ex-Partner: No    Emotionally Abused: No    Physically Abused: No    Sexually Abused: No    FAMILY HISTORY: Family History  Problem Relation Age of Onset   Hypertension Mother    Melanoma Mother    Asthma Mother    Hyperlipidemia Mother    Parkinson's disease  Father    Dementia Father    Hyperlipidemia Father    Hypertension Father    Healthy Sister    Healthy Brother    Breast cancer Maternal Aunt        dx 64s   Melanoma Paternal Aunt    Cancer Maternal Grandmother        "GI cancer" dx 30s   Cancer Maternal Grandfather        "GI cancer" dx 60s   Breast cancer Paternal Grandmother        dx 73s   Cancer Paternal Grandmother    Parkinson's disease Paternal Grandfather    Healthy Daughter    Healthy Son    Cancer Maternal Aunt     ALLERGIES:  is allergic to cephalexin and penicillins.  MEDICATIONS:  Current Outpatient Medications  Medication Sig Dispense Refill   Aspirin 81 MG CAPS Take by mouth.     Calcium  Polycarbophil (FIBER) 625 MG TABS Take by mouth.     Cholecalciferol 75 MCG (3000 UT) TABS Take by mouth.     co-enzyme Q-10 30 MG capsule Take by mouth.     esomeprazole (NEXIUM) 20 MG capsule Take by mouth.     ibuprofen (ADVIL) 200 MG tablet Take by mouth.     ketoconazole  (NIZORAL ) 2 % shampoo SHAMPOO INTO THE SCALP LET SIT 5 TO 10 MINUTES THEN WASH OFF USE  3 DAYS PER WEEK 120 mL 2   losartan -hydrochlorothiazide (HYZAAR) 100-25 MG tablet Take 1 tablet by mouth daily. 90 tablet 4   mometasone  (ELOCON ) 0.1 % lotion APPLY TOPICALLY DAILY AS NEEDED UP TO 5 DAYS PER WEEK 120 mL 2   Multiple Vitamin (MULTI-VITAMINS) TABS Take 1 tablet by mouth daily.     nebivolol  (BYSTOLIC ) 2.5 MG tablet TAKE 1 TABLET DAILY BY MOUTH FOR HYPERTENSION 90 tablet 0   rosuvastatin  (CRESTOR ) 20 MG tablet Take 1 tablet (20 mg total) by mouth daily. 90 tablet 4   No current facility-administered medications for this visit.    Review of Systems  Constitutional:  Negative for appetite change, chills, fatigue and fever.  HENT:   Negative for hearing loss and voice change.  Eyes:  Negative for eye problems.  Respiratory:  Negative for chest tightness and cough.   Cardiovascular:  Negative for chest pain.  Gastrointestinal:  Negative for  abdominal distention, abdominal pain and blood in stool.  Endocrine: Negative for hot flashes.  Genitourinary:  Negative for difficulty urinating and frequency.   Musculoskeletal:  Negative for arthralgias.  Skin:  Negative for itching and rash.  Neurological:  Negative for extremity weakness.  Hematological:  Negative for adenopathy.  Psychiatric/Behavioral:  Negative for confusion.      PHYSICAL EXAMINATION: ECOG PERFORMANCE STATUS: 0 - Asymptomatic  Vitals:   05/03/24 0844  BP: (!) 146/56  Pulse: 67  Resp: 16  Temp: (!) 97 F (36.1 C)  SpO2: 96%   Filed Weights   05/03/24 0844  Weight: 256 lb (116.1 kg)    Physical Exam Constitutional:      General: She is not in acute distress.    Appearance: She is obese. She is not diaphoretic.  HENT:     Head: Normocephalic.  Eyes:     General: No scleral icterus. Cardiovascular:     Rate and Rhythm: Normal rate and regular rhythm.     Heart sounds: No murmur heard. Pulmonary:     Effort: Pulmonary effort is normal. No respiratory distress.     Breath sounds: No wheezing.  Abdominal:     General: There is no distension.     Palpations: Abdomen is soft.     Tenderness: There is no abdominal tenderness.  Musculoskeletal:        General: Normal range of motion.     Cervical back: Normal range of motion and neck supple.  Skin:    General: Skin is warm and dry.     Findings: No erythema.  Neurological:     Mental Status: She is alert and oriented to person, place, and time. Mental status is at baseline.     Motor: No abnormal muscle tone.  Psychiatric:        Mood and Affect: Mood and affect normal.      LABORATORY DATA:  I have reviewed the data as listed    Latest Ref Rng & Units 03/28/2024   10:21 AM 09/01/2023    8:58 AM 07/23/2022    8:27 AM  CBC  WBC 4.0 - 10.5 K/uL 7.3  6.8  6.6   Hemoglobin 12.0 - 15.0 g/dL 16.1  09.6  04.5   Hematocrit 36.0 - 46.0 % 40.2  42.7  41.1   Platelets 150 - 400 K/uL 253  274   247       Latest Ref Rng & Units 03/28/2024   10:21 AM 02/19/2024   10:18 AM 09/01/2023    8:58 AM  CMP  Glucose 70 - 99 mg/dL 409  811  914   BUN 8 - 23 mg/dL 8  9  11    Creatinine 0.44 - 1.00 mg/dL 7.82  9.56  2.13   Sodium 135 - 145 mmol/L 135  140  140   Potassium 3.5 - 5.1 mmol/L 3.6  4.0  3.6   Chloride 98 - 111 mmol/L 99  97  97   CO2 22 - 32 mmol/L 24  26  24    Calcium  8.9 - 10.3 mg/dL 9.3  9.8  9.8   Total Protein 6.5 - 8.1 g/dL 7.3  7.4  7.2   Total Bilirubin 0.0 - 1.2 mg/dL 0.7  0.6  0.6   Alkaline Phos 38 - 126 U/L 122  139  143   AST 15 - 41 U/L 21  37  19   ALT 0 - 44 U/L 33  32  27      RADIOGRAPHIC STUDIES: I have personally reviewed the radiological images as listed and agreed with the findings in the report. MM Breast Surgical Specimen Result Date: 04/07/2024 CLINICAL DATA:  Post excision of a right breast lesion following radioactive seed localization. Assess surgical specimen. Patient had an initial seed placed at retracted along the needle tract. A second seed was placed within the lesion. EXAM: SPECIMEN RADIOGRAPH OF THE RIGHT BREAST COMPARISON:  Previous exam(s). FINDINGS: Status post excision of the right breast. The radioactive seed and coil shaped post biopsy marker clip and associated small mass are present within the specimen, marked for pathology. The second seed was submitted within a specimen container. IMPRESSION: Specimen radiograph of the right breast. Electronically Signed   By: Amanda Jungling M.D.   On: 04/07/2024 08:40   MM CLIP PLACEMENT RIGHT Result Date: 04/05/2024 CLINICAL DATA:  Assess radioactive seed placements following ultrasound-guided localization of a right breast carcinoma prior to surgical excision. EXAM: DIAGNOSTIC RIGHT MAMMOGRAM POST ULTRASOUND-GUIDED RADIOACTIVE SEED PLACEMENT COMPARISON:  Previous exam(s). ACR Breast Density Category b: There are scattered areas of fibroglandular density. FINDINGS: Mammographic images were obtained  following ultrasound-guided radioactive seed placement. These demonstrate the initial seed to have retracted 3.2 cm anterolateral to the coil shaped post biopsy marker clip. Seed was pulled back along needle tract. A second seed was placed. Images show this seed to be well position, within the mass and directly adjacent to the coil shaped post biopsy marker clip. IMPRESSION: Appropriate location of the second radioactive seed. Initial radioactive seed is displaced/retracted anterolateral to coil shaped post biopsy marker clip. Final Assessment: Post Procedure Mammograms for Seed Placement BI-RADS CATEGORY  46M: Post-Procedure Mammogram for Marker Placement Electronically Signed   By: Amanda Jungling M.D.   On: 04/05/2024 11:40   US  RT RADIOACTIVE SEED EA ADD LESION Result Date: 04/05/2024 : A second radioactive seed needed to be placed on this patient due to retraction of the initial seed. Please refer to the dictated report for accession number [id number]. Electronically Signed   By: Amanda Jungling M.D.   On: 04/05/2024 11:38   US  RT RADIOACTIVE SEED LOC Addendum Date: 04/05/2024 ADDENDUM REPORT: 04/05/2024 11:32 ADDENDUM: Post seed placement mammogram show the radioactive seed to have retracted towards the skin, 3.3 cm anterolateral to the coil shaped post biopsy marker clip. For this reason, a second seed was placed. Second localizing seed: Using ultrasound guidance, sterile technique, 1% lidocaine  and an I-125 radioactive seed, the hypoechoic irregular mass at 11 o'clock, 7 cm the nipple, was localized using an inferior approach. The follow-up mammogram images confirm the seed in the expected location and were marked for Dr. Delane Fear. Follow-up survey of the patient confirms presence of the radioactive seed. Order number of I-125 seed:  [id number]. Total activity:  0.239 millicuries.  Reference Date: 03/16/2024. Second postprocedure mammograms were obtained. These demonstrate positioning of the second  localization seed to lie within the mass and directly adjacent to the coil shaped post biopsy marker clip. Electronically Signed   By: Amanda Jungling M.D.   On: 04/05/2024 11:32   Result Date: 04/05/2024 CLINICAL DATA:  Patient presents for localization of a right breast carcinoma prior to surgical excision. EXAM: ULTRASOUND GUIDED RADIOACTIVE SEED LOCALIZATION OF THE RIGHT BREAST COMPARISON:  Previous exam(s). FINDINGS: Patient presents for radioactive seed localization  prior to surgical excision. I met with the patient and we discussed the procedure of seed localization including benefits and alternatives. We discussed the high likelihood of a successful procedure. We discussed the risks of the procedure including infection, bleeding, tissue injury and further surgery. We discussed the low dose of radioactivity involved in the procedure. Informed, written consent was given. The usual time-out protocol was performed immediately prior to the procedure. Using ultrasound guidance, sterile technique, 1% lidocaine  and an I-125 radioactive seed, the small showing mass associated distortion was localized using an inferior approach. The follow-up mammogram images confirm the seed in the expected location and were marked for Dr. Delane Fear. Follow-up survey of the patient confirms presence of the radioactive seed. Order number of I-125 seed:  [id number]. Total activity:  0.239 millicuries.  Reference Date: 03/16/2024. The patient tolerated the procedure well and was [facility]. She was given instructions regarding seed removal. IMPRESSION: Radioactive seed localization of the right breast. No apparent complications. Electronically Signed: By: Amanda Jungling M.D. On: 04/05/2024 11:16

## 2024-05-03 NOTE — Assessment & Plan Note (Signed)
 Association between Lynch syndrome to breast cancer is unknown. Discussed with genetic counselor she does not need additional genetic testing.

## 2024-05-03 NOTE — Consult Note (Signed)
 NEW PATIENT EVALUATION  Name: Pamela Reid  MRN: [id number]  Date:   05/03/2024     DOB: [birth year]   This 68 y.o. female patient presents to the clinic for initial evaluation of pathologic stage Ia (pT1b NX     M0) ER positive invasive mammary carcinoma status post wide local excision and sentinel node biopsy.  REFERRING PHYSICIAN: Timmy Forbes, MD  CHIEF COMPLAINT:  Chief Complaint  Patient presents with   Breast Cancer    DIAGNOSIS: The encounter diagnosis was Malignant neoplasm of upper-outer quadrant of right female breast, unspecified estrogen receptor status (HCC).   PREVIOUS INVESTIGATIONS:  Mammogram and ultrasound reviewed Clinical notes reviewed Pathology report reviewed  HPI: Patient is a 68 year old female who presented with an abnormal mammogram of her right breast.  There are actually 2 lesions 1 a 5 mm spiculated mass in the 11 o'clock position as well as a 9 mm group of coarse heterogeneous calcifications with low suspicions for malignancy.  Both were biopsied.  The 5 mm spiculated mass was positive for invasive mammary carcinoma.  Ultrasound of her axilla was normal.  She went on to have a wide local excision showing a 0.7 cm grade 1 invasive ductal carcinoma with all margins clear at 0.5 cm.  Tumor was ER positive PR positive and HER2/neu not overexpressed.  Patient's Oncotype recurrence score was 20 she will not receive systemic treatment.  She tolerated her surgery well and is basically without complaint specifically denies breast tenderness cough or bone pain she is seen today for radiation oncology opinion.    PLANNED TREATMENT REGIMEN: Hypofractionated right whole breast radiation  PAST MEDICAL HISTORY:  has a past medical history of Allergy, Cancer (HCC), Family history of breast cancer, Family history of melanoma, GERD (gastroesophageal reflux disease), Heart murmur (as a child - mitral valve), Hypertension, Lynch syndrome, and Thyroid  disease (Thyroid  biopsy for  tumor 2013).    PAST SURGICAL HISTORY:  Past Surgical History:  Procedure Laterality Date   ABDOMINAL HYSTERECTOMY     BREAST BIOPSY Left 2019   stereotatic bx neg   BREAST BIOPSY Right 03/22/2024   Stereo bx, ribbon clip, path pending   BREAST BIOPSY Right 03/22/2024   MM RT BREAST BX W LOC DEV 1ST LESION IMAGE BX SPEC STEREO GUIDE 03/22/2024 [facility]   BREAST BIOPSY Right 03/22/2024   US  RT BREAST BX W LOC DEV 1ST LESION IMG BX SPEC US  GUIDE 03/22/2024 [facility]   BREAST BIOPSY  04/05/2024   US  RT RADIOACTIVE SEED LOC 04/05/2024 [facility]   BREAST BIOPSY Right 04/05/2024   US  RT RADIOACTIVE SEED EA ADD LESION 04/05/2024 [facility]   BREAST LUMPECTOMY WITH RADIOACTIVE SEED LOCALIZATION Right 04/07/2024   Procedure: BREAST LUMPECTOMY WITH RADIOACTIVE SEED LOCALIZATION;  Surgeon: Enid Harry, MD;  Location: [facility];  Service: General;  Laterality: Right;   CHOLECYSTECTOMY     COLON SURGERY  2017   COLONOSCOPY WITH PROPOFOL  N/A 09/11/2021   Procedure: COLONOSCOPY WITH PROPOFOL ;  Surgeon: Selena Daily, MD;  Location: [facility];  Service: Gastroenterology;  Laterality: N/A;   TUBAL LIGATION  1986    FAMILY HISTORY: family history includes Asthma in her mother; Breast cancer in her maternal aunt and paternal grandmother; Cancer in her maternal aunt, maternal grandfather, maternal grandmother, and paternal grandmother; Dementia in her father; Healthy in her brother, daughter, sister, and son; Hyperlipidemia in her father and mother; Hypertension in her father and mother; Melanoma in her mother and paternal aunt; Parkinson's  disease in her father and paternal grandfather.  SOCIAL HISTORY:  reports that she has never smoked. She has never used smokeless tobacco. She reports current alcohol use of about 1.0 standard drink of alcohol per week. She reports that she does not use drugs.  ALLERGIES: Cephalexin and Penicillins  MEDICATIONS:   Current Outpatient Medications  Medication Sig Dispense Refill   Aspirin 81 MG CAPS Take by mouth.     Calcium  Polycarbophil (FIBER) 625 MG TABS Take by mouth.     Cholecalciferol 75 MCG (3000 UT) TABS Take by mouth.     co-enzyme Q-10 30 MG capsule Take by mouth.     esomeprazole (NEXIUM) 20 MG capsule Take by mouth.     ibuprofen (ADVIL) 200 MG tablet Take by mouth.     ketoconazole  (NIZORAL ) 2 % shampoo SHAMPOO INTO THE SCALP LET SIT 5 TO 10 MINUTES THEN WASH OFF USE  3 DAYS PER WEEK 120 mL 2   losartan -hydrochlorothiazide (HYZAAR) 100-25 MG tablet Take 1 tablet by mouth daily. 90 tablet 4   mometasone  (ELOCON ) 0.1 % lotion APPLY TOPICALLY DAILY AS NEEDED UP TO 5 DAYS PER WEEK 120 mL 2   Multiple Vitamin (MULTI-VITAMINS) TABS Take 1 tablet by mouth daily.     nebivolol  (BYSTOLIC ) 2.5 MG tablet TAKE 1 TABLET DAILY BY MOUTH FOR HYPERTENSION 90 tablet 0   rosuvastatin  (CRESTOR ) 20 MG tablet Take 1 tablet (20 mg total) by mouth daily. 90 tablet 4   No current facility-administered medications for this encounter.    ECOG PERFORMANCE STATUS:  0 - Asymptomatic  REVIEW OF SYSTEMS: Patient denies any weight loss, fatigue, weakness, fever, chills or night sweats. Patient denies any loss of vision, blurred vision. Patient denies any ringing  of the ears or hearing loss. No irregular heartbeat. Patient denies heart murmur or history of fainting. Patient denies any chest pain or pain radiating to her upper extremities. Patient denies any shortness of breath, difficulty breathing at night, cough or hemoptysis. Patient denies any swelling in the lower legs. Patient denies any nausea vomiting, vomiting of blood, or coffee ground material in the vomitus. Patient denies any stomach pain. Patient states has had normal bowel movements no significant constipation or diarrhea. Patient denies any dysuria, hematuria or significant nocturia. Patient denies any problems walking, swelling in the joints or loss of  balance. Patient denies any skin changes, loss of hair or loss of weight. Patient denies any excessive worrying or anxiety or significant depression. Patient denies any problems with insomnia. Patient denies excessive thirst, polyuria, polydipsia. Patient denies any swollen glands, patient denies easy bruising or easy bleeding. Patient denies any recent infections, allergies or URI. Patient "s visual fields have not changed significantly in recent time.   PHYSICAL EXAM: There were no vitals taken for this visit. The patient is status post wide local excision of the right breast.  Incision is well-healed no dominant masses noted in either breast no axillary or supraclavicular adenopathy is appreciated.  Well-developed well-nourished patient in NAD. HEENT reveals PERLA, EOMI, discs not visualized.  Oral cavity is clear. No oral mucosal lesions are identified. Neck is clear without evidence of cervical or supraclavicular adenopathy. Lungs are clear to A&P. Cardiac examination is essentially unremarkable with regular rate and rhythm without murmur rub or thrill. Abdomen is benign with no organomegaly or masses noted. Motor sensory and DTR levels are equal and symmetric in the upper and lower extremities. Cranial nerves II through XII are grossly intact. Proprioception is intact.  No peripheral adenopathy or edema is identified. No motor or sensory levels are noted. Crude visual fields are within normal range.  LABORATORY DATA: Pathology reports reviewed    RADIOLOGY RESULTS: Mammogram and ultrasound reviewed compatible with above-stated findings   IMPRESSION: Stage Ia ER positive invasive mammary carcinoma of the right breast status post wide local excision in 68 year old female  PLAN: Based on the small tumor size ER positive score do not think axillary lymph node dissection or sentinel node biopsy is needed at this time.  Would treat with hypofractionated whole breast radiation over 3 weeks boosting her  scar another 1000 cGy using photon beam therapy.  Risks and benefits of treatment including skin reaction fatigue alteration of blood counts possible inclusion of superficial lung all were reviewed with the patient in detail.  She has a vacation the end of June and we are can try to speed up her start to treatment and I am simulating her tomorrow.  Patient also be candidate for endocrine therapy after completion of radiation.  Patient comprehends her recommendations well.  I would like to take this opportunity to thank you for allowing me to participate in the care of your patient.Glenis Langdon, MD

## 2024-05-03 NOTE — Assessment & Plan Note (Signed)
 Stage I right breast invasive carcinoma. pT1b pNx, ER 100%, PR 40%, HER2 low (1+) Ki67 7% Pathology results were reviewed with patient.  Oncotype Dx 20, <1% chemotherapy benefit. Will not offer adjuvant chemotherapy.  Recommend adjuvant radiation.  After that, recommend AI for 5 years.

## 2024-05-04 ENCOUNTER — Ambulatory Visit
Admission: RE | Admit: 2024-05-04 | Discharge: 2024-05-04 | Disposition: A | Discharge status: Home / Self Care | Source: Ambulatory Visit | Attending: Radiation Oncology | Admitting: Radiation Oncology

## 2024-05-04 ENCOUNTER — Ambulatory Visit: Admitting: Occupational Therapy

## 2024-05-04 ENCOUNTER — Encounter: Payer: Self-pay | Admitting: Oncology

## 2024-05-04 ENCOUNTER — Encounter: Payer: Self-pay | Admitting: *Deleted

## 2024-05-04 ENCOUNTER — Telehealth: Payer: Self-pay

## 2024-05-04 DIAGNOSIS — Z510 Encounter for antineoplastic radiation therapy: Secondary | ICD-10-CM | POA: Diagnosis not present

## 2024-05-04 DIAGNOSIS — Z1721 Progesterone receptor positive status: Secondary | ICD-10-CM | POA: Diagnosis not present

## 2024-05-04 DIAGNOSIS — C50411 Malignant neoplasm of upper-outer quadrant of right female breast: Secondary | ICD-10-CM | POA: Diagnosis not present

## 2024-05-04 DIAGNOSIS — Z170 Estrogen receptor positive status [ER+]: Secondary | ICD-10-CM | POA: Diagnosis not present

## 2024-05-04 NOTE — Telephone Encounter (Signed)
-----   Message from Timmy Forbes sent at 05/03/2024  6:15 PM EDT ----- Hi Team,  I saw her today. No need for chemotherapy. She sees Radonc today for adjuvant RT.  I plan to see her 2-3 weeks after finishing RT.  Please arrange when we gets her radiation schedule. MD only.  Thanks.   Allyne Areola

## 2024-05-04 NOTE — Telephone Encounter (Signed)
 Please schedule MD approx 2-3 weeks after finishing RT. Please notify pt of appt details.

## 2024-05-05 ENCOUNTER — Other Ambulatory Visit: Payer: Self-pay | Admitting: *Deleted

## 2024-05-05 DIAGNOSIS — Z1721 Progesterone receptor positive status: Secondary | ICD-10-CM | POA: Diagnosis not present

## 2024-05-05 DIAGNOSIS — C50411 Malignant neoplasm of upper-outer quadrant of right female breast: Secondary | ICD-10-CM | POA: Diagnosis not present

## 2024-05-05 DIAGNOSIS — Z510 Encounter for antineoplastic radiation therapy: Secondary | ICD-10-CM | POA: Diagnosis not present

## 2024-05-05 DIAGNOSIS — Z170 Estrogen receptor positive status [ER+]: Secondary | ICD-10-CM | POA: Diagnosis not present

## 2024-05-06 ENCOUNTER — Encounter: Payer: Self-pay | Admitting: Nurse Practitioner

## 2024-05-06 ENCOUNTER — Encounter: Payer: Self-pay | Admitting: Oncology

## 2024-05-09 ENCOUNTER — Encounter: Payer: Self-pay | Admitting: Nurse Practitioner

## 2024-05-10 ENCOUNTER — Ambulatory Visit
Admission: RE | Admit: 2024-05-10 | Discharge: 2024-05-10 | Disposition: A | Discharge status: Home / Self Care | Source: Ambulatory Visit | Attending: Radiation Oncology | Admitting: Radiation Oncology

## 2024-05-10 ENCOUNTER — Encounter: Payer: Self-pay | Admitting: *Deleted

## 2024-05-10 DIAGNOSIS — C50411 Malignant neoplasm of upper-outer quadrant of right female breast: Secondary | ICD-10-CM | POA: Diagnosis not present

## 2024-05-10 DIAGNOSIS — Z510 Encounter for antineoplastic radiation therapy: Secondary | ICD-10-CM | POA: Diagnosis not present

## 2024-05-10 DIAGNOSIS — Z170 Estrogen receptor positive status [ER+]: Secondary | ICD-10-CM | POA: Diagnosis not present

## 2024-05-10 DIAGNOSIS — Z1721 Progesterone receptor positive status: Secondary | ICD-10-CM | POA: Diagnosis not present

## 2024-05-11 ENCOUNTER — Other Ambulatory Visit: Payer: Self-pay

## 2024-05-11 ENCOUNTER — Ambulatory Visit
Admission: RE | Admit: 2024-05-11 | Discharge: 2024-05-11 | Disposition: A | Discharge status: Home / Self Care | Source: Ambulatory Visit | Attending: Radiation Oncology | Admitting: Radiation Oncology

## 2024-05-11 DIAGNOSIS — Z170 Estrogen receptor positive status [ER+]: Secondary | ICD-10-CM | POA: Diagnosis not present

## 2024-05-11 DIAGNOSIS — Z1721 Progesterone receptor positive status: Secondary | ICD-10-CM | POA: Diagnosis not present

## 2024-05-11 DIAGNOSIS — Z510 Encounter for antineoplastic radiation therapy: Secondary | ICD-10-CM | POA: Diagnosis not present

## 2024-05-11 DIAGNOSIS — C50411 Malignant neoplasm of upper-outer quadrant of right female breast: Secondary | ICD-10-CM | POA: Diagnosis not present

## 2024-05-11 LAB — RAD ONC ARIA SESSION SUMMARY
Course Elapsed Days: 0
Plan Fractions Treated to Date: 1
Plan Prescribed Dose Per Fraction: 2.66 Gy
Plan Total Fractions Prescribed: 16
Plan Total Prescribed Dose: 42.56 Gy
Reference Point Dosage Given to Date: 2.66 Gy
Reference Point Session Dosage Given: 2.66 Gy
Session Number: 1

## 2024-05-12 ENCOUNTER — Ambulatory Visit
Admission: RE | Admit: 2024-05-12 | Discharge: 2024-05-12 | Disposition: A | Discharge status: Home / Self Care | Source: Ambulatory Visit | Attending: Radiation Oncology | Admitting: Radiation Oncology

## 2024-05-12 ENCOUNTER — Other Ambulatory Visit: Payer: Self-pay

## 2024-05-12 DIAGNOSIS — Z510 Encounter for antineoplastic radiation therapy: Secondary | ICD-10-CM | POA: Diagnosis not present

## 2024-05-12 DIAGNOSIS — Z1721 Progesterone receptor positive status: Secondary | ICD-10-CM | POA: Diagnosis not present

## 2024-05-12 DIAGNOSIS — Z170 Estrogen receptor positive status [ER+]: Secondary | ICD-10-CM | POA: Diagnosis not present

## 2024-05-12 DIAGNOSIS — C50411 Malignant neoplasm of upper-outer quadrant of right female breast: Secondary | ICD-10-CM | POA: Diagnosis not present

## 2024-05-12 LAB — RAD ONC ARIA SESSION SUMMARY
Course Elapsed Days: 1
Plan Fractions Treated to Date: 2
Plan Prescribed Dose Per Fraction: 2.66 Gy
Plan Total Fractions Prescribed: 16
Plan Total Prescribed Dose: 42.56 Gy
Reference Point Dosage Given to Date: 5.32 Gy
Reference Point Session Dosage Given: 2.66 Gy
Session Number: 2

## 2024-05-13 ENCOUNTER — Other Ambulatory Visit: Payer: Self-pay

## 2024-05-13 ENCOUNTER — Ambulatory Visit
Admission: RE | Admit: 2024-05-13 | Discharge: 2024-05-13 | Disposition: A | Discharge status: Home / Self Care | Source: Ambulatory Visit | Attending: Radiation Oncology | Admitting: Radiation Oncology

## 2024-05-13 DIAGNOSIS — Z170 Estrogen receptor positive status [ER+]: Secondary | ICD-10-CM | POA: Diagnosis not present

## 2024-05-13 DIAGNOSIS — C50411 Malignant neoplasm of upper-outer quadrant of right female breast: Secondary | ICD-10-CM | POA: Diagnosis not present

## 2024-05-13 DIAGNOSIS — Z510 Encounter for antineoplastic radiation therapy: Secondary | ICD-10-CM | POA: Diagnosis not present

## 2024-05-13 DIAGNOSIS — Z1721 Progesterone receptor positive status: Secondary | ICD-10-CM | POA: Diagnosis not present

## 2024-05-13 LAB — RAD ONC ARIA SESSION SUMMARY
Course Elapsed Days: 2
Plan Fractions Treated to Date: 3
Plan Prescribed Dose Per Fraction: 2.66 Gy
Plan Total Fractions Prescribed: 16
Plan Total Prescribed Dose: 42.56 Gy
Reference Point Dosage Given to Date: 7.98 Gy
Reference Point Session Dosage Given: 2.66 Gy
Session Number: 3

## 2024-05-16 ENCOUNTER — Other Ambulatory Visit: Payer: Self-pay

## 2024-05-16 ENCOUNTER — Inpatient Hospital Stay

## 2024-05-16 ENCOUNTER — Ambulatory Visit
Admission: RE | Admit: 2024-05-16 | Discharge: 2024-05-16 | Disposition: A | Discharge status: Home / Self Care | Source: Ambulatory Visit | Attending: Radiation Oncology | Admitting: Radiation Oncology

## 2024-05-16 DIAGNOSIS — Z170 Estrogen receptor positive status [ER+]: Secondary | ICD-10-CM | POA: Diagnosis not present

## 2024-05-16 DIAGNOSIS — C50411 Malignant neoplasm of upper-outer quadrant of right female breast: Secondary | ICD-10-CM

## 2024-05-16 DIAGNOSIS — Z1721 Progesterone receptor positive status: Secondary | ICD-10-CM | POA: Diagnosis not present

## 2024-05-16 DIAGNOSIS — Z510 Encounter for antineoplastic radiation therapy: Secondary | ICD-10-CM | POA: Diagnosis not present

## 2024-05-16 LAB — RAD ONC ARIA SESSION SUMMARY
Course Elapsed Days: 5
Plan Fractions Treated to Date: 4
Plan Prescribed Dose Per Fraction: 2.66 Gy
Plan Total Fractions Prescribed: 16
Plan Total Prescribed Dose: 42.56 Gy
Reference Point Dosage Given to Date: 10.64 Gy
Reference Point Session Dosage Given: 2.66 Gy
Session Number: 4

## 2024-05-16 LAB — CBC (CANCER CENTER ONLY)
HCT: 39.8 % (ref 36.0–46.0)
Hemoglobin: 13.4 g/dL (ref 12.0–15.0)
MCH: 29.4 pg (ref 26.0–34.0)
MCHC: 33.7 g/dL (ref 30.0–36.0)
MCV: 87.3 fL (ref 80.0–100.0)
Platelet Count: 286 10*3/uL (ref 150–400)
RBC: 4.56 MIL/uL (ref 3.87–5.11)
RDW: 12.5 % (ref 11.5–15.5)
WBC Count: 9.6 10*3/uL (ref 4.0–10.5)
nRBC: 0 % (ref 0.0–0.2)

## 2024-05-17 ENCOUNTER — Ambulatory Visit
Admission: RE | Admit: 2024-05-17 | Discharge: 2024-05-17 | Disposition: A | Discharge status: Home / Self Care | Source: Ambulatory Visit | Attending: Radiation Oncology | Admitting: Radiation Oncology

## 2024-05-17 ENCOUNTER — Other Ambulatory Visit: Payer: Self-pay

## 2024-05-17 ENCOUNTER — Other Ambulatory Visit: Payer: Self-pay | Admitting: Nurse Practitioner

## 2024-05-17 DIAGNOSIS — Z1721 Progesterone receptor positive status: Secondary | ICD-10-CM | POA: Diagnosis not present

## 2024-05-17 DIAGNOSIS — C50411 Malignant neoplasm of upper-outer quadrant of right female breast: Secondary | ICD-10-CM | POA: Diagnosis not present

## 2024-05-17 DIAGNOSIS — Z510 Encounter for antineoplastic radiation therapy: Secondary | ICD-10-CM | POA: Diagnosis not present

## 2024-05-17 DIAGNOSIS — Z170 Estrogen receptor positive status [ER+]: Secondary | ICD-10-CM | POA: Diagnosis not present

## 2024-05-17 LAB — RAD ONC ARIA SESSION SUMMARY
Course Elapsed Days: 6
Plan Fractions Treated to Date: 5
Plan Prescribed Dose Per Fraction: 2.66 Gy
Plan Total Fractions Prescribed: 16
Plan Total Prescribed Dose: 42.56 Gy
Reference Point Dosage Given to Date: 13.3 Gy
Reference Point Session Dosage Given: 2.66 Gy
Session Number: 5

## 2024-05-18 ENCOUNTER — Other Ambulatory Visit: Payer: Self-pay

## 2024-05-18 ENCOUNTER — Ambulatory Visit
Admission: RE | Admit: 2024-05-18 | Discharge: 2024-05-18 | Disposition: A | Discharge status: Home / Self Care | Source: Ambulatory Visit | Attending: Radiation Oncology | Admitting: Radiation Oncology

## 2024-05-18 DIAGNOSIS — Z510 Encounter for antineoplastic radiation therapy: Secondary | ICD-10-CM | POA: Diagnosis not present

## 2024-05-18 DIAGNOSIS — Z1721 Progesterone receptor positive status: Secondary | ICD-10-CM | POA: Diagnosis not present

## 2024-05-18 DIAGNOSIS — Z170 Estrogen receptor positive status [ER+]: Secondary | ICD-10-CM | POA: Diagnosis not present

## 2024-05-18 DIAGNOSIS — C50411 Malignant neoplasm of upper-outer quadrant of right female breast: Secondary | ICD-10-CM | POA: Diagnosis not present

## 2024-05-18 LAB — RAD ONC ARIA SESSION SUMMARY
Course Elapsed Days: 7
Plan Fractions Treated to Date: 6
Plan Prescribed Dose Per Fraction: 2.66 Gy
Plan Total Fractions Prescribed: 16
Plan Total Prescribed Dose: 42.56 Gy
Reference Point Dosage Given to Date: 15.96 Gy
Reference Point Session Dosage Given: 2.66 Gy
Session Number: 6

## 2024-05-19 ENCOUNTER — Ambulatory Visit
Admission: RE | Admit: 2024-05-19 | Discharge: 2024-05-19 | Disposition: A | Discharge status: Home / Self Care | Source: Ambulatory Visit | Attending: Radiation Oncology | Admitting: Radiation Oncology

## 2024-05-19 ENCOUNTER — Other Ambulatory Visit: Payer: Self-pay

## 2024-05-19 DIAGNOSIS — Z1721 Progesterone receptor positive status: Secondary | ICD-10-CM | POA: Diagnosis not present

## 2024-05-19 DIAGNOSIS — Z170 Estrogen receptor positive status [ER+]: Secondary | ICD-10-CM | POA: Diagnosis not present

## 2024-05-19 DIAGNOSIS — Z510 Encounter for antineoplastic radiation therapy: Secondary | ICD-10-CM | POA: Diagnosis not present

## 2024-05-19 DIAGNOSIS — C50411 Malignant neoplasm of upper-outer quadrant of right female breast: Secondary | ICD-10-CM | POA: Diagnosis not present

## 2024-05-19 LAB — RAD ONC ARIA SESSION SUMMARY
Course Elapsed Days: 8
Plan Fractions Treated to Date: 7
Plan Prescribed Dose Per Fraction: 2.66 Gy
Plan Total Fractions Prescribed: 16
Plan Total Prescribed Dose: 42.56 Gy
Reference Point Dosage Given to Date: 18.62 Gy
Reference Point Session Dosage Given: 2.66 Gy
Session Number: 7

## 2024-05-19 NOTE — Telephone Encounter (Signed)
 Unable to refill per protocol, Rx expired discontinued 09/02/23, dose change.  Requested Prescriptions  Pending Prescriptions Disp Refills   rosuvastatin  (CRESTOR ) 10 MG tablet [Pharmacy Med Name: Rosuvastatin  Calcium  10 MG Oral Tablet] 100 tablet 2    Sig: TAKE 1 TABLET BY MOUTH DAILY     Cardiovascular:  Antilipid - Statins 2 Failed - 05/19/2024 11:13 AM      Failed - Lipid Panel in normal range within the last 12 months    Cholesterol, Total  Date Value Ref Range Status  02/19/2024 164 100 - 199 mg/dL Final   LDL Chol Calc (NIH)  Date Value Ref Range Status  02/19/2024 67 0 - 99 mg/dL Final   HDL  Date Value Ref Range Status  02/19/2024 80 >39 mg/dL Final   Triglycerides  Date Value Ref Range Status  02/19/2024 91 0 - 149 mg/dL Final         Passed - Cr in normal range and within 360 days    Creatinine, Ser  Date Value Ref Range Status  03/28/2024 0.62 0.44 - 1.00 mg/dL Final         Passed - Patient is not pregnant      Passed - Valid encounter within last 12 months    Recent Outpatient Visits           3 months ago Class 3 severe obesity due to excess calories without serious comorbidity with body mass index (BMI) of 45.0 to 49.9 in adult [facility])   [facility] [place], Lavelle Posey, NP

## 2024-05-20 ENCOUNTER — Other Ambulatory Visit: Payer: Self-pay

## 2024-05-20 ENCOUNTER — Ambulatory Visit
Admission: RE | Admit: 2024-05-20 | Discharge: 2024-05-20 | Disposition: A | Discharge status: Home / Self Care | Source: Ambulatory Visit | Attending: Radiation Oncology | Admitting: Radiation Oncology

## 2024-05-20 DIAGNOSIS — Z510 Encounter for antineoplastic radiation therapy: Secondary | ICD-10-CM | POA: Diagnosis not present

## 2024-05-20 DIAGNOSIS — Z1721 Progesterone receptor positive status: Secondary | ICD-10-CM | POA: Diagnosis not present

## 2024-05-20 DIAGNOSIS — C50411 Malignant neoplasm of upper-outer quadrant of right female breast: Secondary | ICD-10-CM | POA: Diagnosis not present

## 2024-05-20 DIAGNOSIS — Z170 Estrogen receptor positive status [ER+]: Secondary | ICD-10-CM | POA: Diagnosis not present

## 2024-05-20 LAB — RAD ONC ARIA SESSION SUMMARY
Course Elapsed Days: 9
Plan Fractions Treated to Date: 8
Plan Prescribed Dose Per Fraction: 2.66 Gy
Plan Total Fractions Prescribed: 16
Plan Total Prescribed Dose: 42.56 Gy
Reference Point Dosage Given to Date: 21.28 Gy
Reference Point Session Dosage Given: 2.66 Gy
Session Number: 8

## 2024-05-23 ENCOUNTER — Other Ambulatory Visit: Payer: Self-pay

## 2024-05-23 ENCOUNTER — Ambulatory Visit
Admission: RE | Admit: 2024-05-23 | Discharge: 2024-05-23 | Disposition: A | Discharge status: Home / Self Care | Source: Ambulatory Visit | Attending: Radiation Oncology | Admitting: Radiation Oncology

## 2024-05-23 DIAGNOSIS — Z1721 Progesterone receptor positive status: Secondary | ICD-10-CM | POA: Diagnosis not present

## 2024-05-23 DIAGNOSIS — Z510 Encounter for antineoplastic radiation therapy: Secondary | ICD-10-CM | POA: Diagnosis not present

## 2024-05-23 DIAGNOSIS — C50411 Malignant neoplasm of upper-outer quadrant of right female breast: Secondary | ICD-10-CM | POA: Diagnosis not present

## 2024-05-23 DIAGNOSIS — Z170 Estrogen receptor positive status [ER+]: Secondary | ICD-10-CM | POA: Diagnosis not present

## 2024-05-23 LAB — RAD ONC ARIA SESSION SUMMARY
Course Elapsed Days: 12
Plan Fractions Treated to Date: 9
Plan Prescribed Dose Per Fraction: 2.66 Gy
Plan Total Fractions Prescribed: 16
Plan Total Prescribed Dose: 42.56 Gy
Reference Point Dosage Given to Date: 23.94 Gy
Reference Point Session Dosage Given: 2.66 Gy
Session Number: 9

## 2024-05-24 ENCOUNTER — Ambulatory Visit
Admission: RE | Admit: 2024-05-24 | Discharge: 2024-05-24 | Disposition: A | Discharge status: Home / Self Care | Source: Ambulatory Visit | Attending: Radiation Oncology | Admitting: Radiation Oncology

## 2024-05-24 ENCOUNTER — Other Ambulatory Visit: Payer: Self-pay

## 2024-05-24 DIAGNOSIS — Z1721 Progesterone receptor positive status: Secondary | ICD-10-CM | POA: Diagnosis not present

## 2024-05-24 DIAGNOSIS — Z510 Encounter for antineoplastic radiation therapy: Secondary | ICD-10-CM | POA: Diagnosis not present

## 2024-05-24 DIAGNOSIS — C50411 Malignant neoplasm of upper-outer quadrant of right female breast: Secondary | ICD-10-CM | POA: Diagnosis not present

## 2024-05-24 DIAGNOSIS — Z170 Estrogen receptor positive status [ER+]: Secondary | ICD-10-CM | POA: Diagnosis not present

## 2024-05-24 LAB — RAD ONC ARIA SESSION SUMMARY
Course Elapsed Days: 13
Plan Fractions Treated to Date: 10
Plan Prescribed Dose Per Fraction: 2.66 Gy
Plan Total Fractions Prescribed: 16
Plan Total Prescribed Dose: 42.56 Gy
Reference Point Dosage Given to Date: 26.6 Gy
Reference Point Session Dosage Given: 2.66 Gy
Session Number: 10

## 2024-05-25 ENCOUNTER — Ambulatory Visit

## 2024-05-26 ENCOUNTER — Ambulatory Visit

## 2024-05-27 ENCOUNTER — Ambulatory Visit

## 2024-05-30 ENCOUNTER — Ambulatory Visit

## 2024-05-31 ENCOUNTER — Ambulatory Visit

## 2024-06-01 ENCOUNTER — Ambulatory Visit

## 2024-06-02 ENCOUNTER — Ambulatory Visit

## 2024-06-06 ENCOUNTER — Ambulatory Visit

## 2024-06-07 ENCOUNTER — Ambulatory Visit
Admission: RE | Admit: 2024-06-07 | Discharge: 2024-06-07 | Disposition: A | Discharge status: Home / Self Care | Source: Ambulatory Visit | Attending: Radiation Oncology | Admitting: Radiation Oncology

## 2024-06-07 ENCOUNTER — Other Ambulatory Visit: Payer: Self-pay

## 2024-06-07 ENCOUNTER — Ambulatory Visit

## 2024-06-07 DIAGNOSIS — Z510 Encounter for antineoplastic radiation therapy: Secondary | ICD-10-CM | POA: Insufficient documentation

## 2024-06-07 DIAGNOSIS — Z170 Estrogen receptor positive status [ER+]: Secondary | ICD-10-CM | POA: Diagnosis not present

## 2024-06-07 DIAGNOSIS — C50411 Malignant neoplasm of upper-outer quadrant of right female breast: Secondary | ICD-10-CM | POA: Insufficient documentation

## 2024-06-07 DIAGNOSIS — Z1721 Progesterone receptor positive status: Secondary | ICD-10-CM | POA: Insufficient documentation

## 2024-06-07 LAB — RAD ONC ARIA SESSION SUMMARY
Course Elapsed Days: 27
Plan Fractions Treated to Date: 11
Plan Prescribed Dose Per Fraction: 2.66 Gy
Plan Total Fractions Prescribed: 16
Plan Total Prescribed Dose: 42.56 Gy
Reference Point Dosage Given to Date: 29.26 Gy
Reference Point Session Dosage Given: 2.66 Gy
Session Number: 11

## 2024-06-08 ENCOUNTER — Ambulatory Visit
Admission: RE | Admit: 2024-06-08 | Discharge: 2024-06-08 | Disposition: A | Discharge status: Home / Self Care | Source: Ambulatory Visit | Attending: Radiation Oncology | Admitting: Radiation Oncology

## 2024-06-08 ENCOUNTER — Ambulatory Visit

## 2024-06-08 ENCOUNTER — Other Ambulatory Visit: Payer: Self-pay

## 2024-06-08 DIAGNOSIS — Z170 Estrogen receptor positive status [ER+]: Secondary | ICD-10-CM | POA: Diagnosis not present

## 2024-06-08 DIAGNOSIS — C50411 Malignant neoplasm of upper-outer quadrant of right female breast: Secondary | ICD-10-CM | POA: Diagnosis not present

## 2024-06-08 DIAGNOSIS — Z510 Encounter for antineoplastic radiation therapy: Secondary | ICD-10-CM | POA: Diagnosis not present

## 2024-06-08 DIAGNOSIS — Z1721 Progesterone receptor positive status: Secondary | ICD-10-CM | POA: Diagnosis not present

## 2024-06-08 LAB — RAD ONC ARIA SESSION SUMMARY
Course Elapsed Days: 28
Plan Fractions Treated to Date: 12
Plan Prescribed Dose Per Fraction: 2.66 Gy
Plan Total Fractions Prescribed: 16
Plan Total Prescribed Dose: 42.56 Gy
Reference Point Dosage Given to Date: 31.92 Gy
Reference Point Session Dosage Given: 2.66 Gy
Session Number: 12

## 2024-06-09 ENCOUNTER — Ambulatory Visit

## 2024-06-09 ENCOUNTER — Other Ambulatory Visit: Payer: Self-pay

## 2024-06-09 ENCOUNTER — Ambulatory Visit
Admission: RE | Admit: 2024-06-09 | Discharge: 2024-06-09 | Disposition: A | Discharge status: Home / Self Care | Source: Ambulatory Visit | Attending: Radiation Oncology | Admitting: Radiation Oncology

## 2024-06-09 DIAGNOSIS — Z1721 Progesterone receptor positive status: Secondary | ICD-10-CM | POA: Diagnosis not present

## 2024-06-09 DIAGNOSIS — Z170 Estrogen receptor positive status [ER+]: Secondary | ICD-10-CM | POA: Diagnosis not present

## 2024-06-09 DIAGNOSIS — C50411 Malignant neoplasm of upper-outer quadrant of right female breast: Secondary | ICD-10-CM | POA: Diagnosis not present

## 2024-06-09 DIAGNOSIS — Z510 Encounter for antineoplastic radiation therapy: Secondary | ICD-10-CM | POA: Diagnosis not present

## 2024-06-09 LAB — RAD ONC ARIA SESSION SUMMARY
Course Elapsed Days: 29
Plan Fractions Treated to Date: 13
Plan Prescribed Dose Per Fraction: 2.66 Gy
Plan Total Fractions Prescribed: 16
Plan Total Prescribed Dose: 42.56 Gy
Reference Point Dosage Given to Date: 34.58 Gy
Reference Point Session Dosage Given: 2.66 Gy
Session Number: 13

## 2024-06-10 ENCOUNTER — Ambulatory Visit

## 2024-06-10 ENCOUNTER — Other Ambulatory Visit: Payer: Self-pay

## 2024-06-10 ENCOUNTER — Ambulatory Visit
Admission: RE | Admit: 2024-06-10 | Discharge: 2024-06-10 | Disposition: A | Discharge status: Home / Self Care | Source: Ambulatory Visit | Attending: Radiation Oncology | Admitting: Radiation Oncology

## 2024-06-10 DIAGNOSIS — Z1721 Progesterone receptor positive status: Secondary | ICD-10-CM | POA: Diagnosis not present

## 2024-06-10 DIAGNOSIS — Z170 Estrogen receptor positive status [ER+]: Secondary | ICD-10-CM | POA: Diagnosis not present

## 2024-06-10 DIAGNOSIS — Z510 Encounter for antineoplastic radiation therapy: Secondary | ICD-10-CM | POA: Diagnosis not present

## 2024-06-10 DIAGNOSIS — C50411 Malignant neoplasm of upper-outer quadrant of right female breast: Secondary | ICD-10-CM | POA: Diagnosis not present

## 2024-06-10 LAB — RAD ONC ARIA SESSION SUMMARY
Course Elapsed Days: 30
Plan Fractions Treated to Date: 14
Plan Prescribed Dose Per Fraction: 2.66 Gy
Plan Total Fractions Prescribed: 16
Plan Total Prescribed Dose: 42.56 Gy
Reference Point Dosage Given to Date: 37.24 Gy
Reference Point Session Dosage Given: 2.66 Gy
Session Number: 14

## 2024-06-13 ENCOUNTER — Ambulatory Visit

## 2024-06-13 ENCOUNTER — Ambulatory Visit
Admission: RE | Admit: 2024-06-13 | Discharge: 2024-06-13 | Disposition: A | Discharge status: Home / Self Care | Source: Ambulatory Visit | Attending: Radiation Oncology | Admitting: Radiation Oncology

## 2024-06-13 ENCOUNTER — Other Ambulatory Visit: Payer: Self-pay

## 2024-06-13 DIAGNOSIS — Z170 Estrogen receptor positive status [ER+]: Secondary | ICD-10-CM | POA: Diagnosis not present

## 2024-06-13 DIAGNOSIS — Z510 Encounter for antineoplastic radiation therapy: Secondary | ICD-10-CM | POA: Diagnosis not present

## 2024-06-13 DIAGNOSIS — Z1721 Progesterone receptor positive status: Secondary | ICD-10-CM | POA: Diagnosis not present

## 2024-06-13 DIAGNOSIS — C50411 Malignant neoplasm of upper-outer quadrant of right female breast: Secondary | ICD-10-CM | POA: Diagnosis not present

## 2024-06-13 LAB — RAD ONC ARIA SESSION SUMMARY
Course Elapsed Days: 33
Plan Fractions Treated to Date: 15
Plan Prescribed Dose Per Fraction: 2.66 Gy
Plan Total Fractions Prescribed: 16
Plan Total Prescribed Dose: 42.56 Gy
Reference Point Dosage Given to Date: 39.9 Gy
Reference Point Session Dosage Given: 2.66 Gy
Session Number: 15

## 2024-06-14 ENCOUNTER — Other Ambulatory Visit: Payer: Self-pay

## 2024-06-14 ENCOUNTER — Ambulatory Visit
Admission: RE | Admit: 2024-06-14 | Discharge: 2024-06-14 | Disposition: A | Discharge status: Home / Self Care | Source: Ambulatory Visit | Attending: Radiation Oncology | Admitting: Radiation Oncology

## 2024-06-14 ENCOUNTER — Other Ambulatory Visit: Payer: Self-pay | Admitting: Medical Genetics

## 2024-06-14 ENCOUNTER — Ambulatory Visit

## 2024-06-14 DIAGNOSIS — Z1721 Progesterone receptor positive status: Secondary | ICD-10-CM | POA: Diagnosis not present

## 2024-06-14 DIAGNOSIS — Z170 Estrogen receptor positive status [ER+]: Secondary | ICD-10-CM | POA: Diagnosis not present

## 2024-06-14 DIAGNOSIS — Z510 Encounter for antineoplastic radiation therapy: Secondary | ICD-10-CM | POA: Diagnosis not present

## 2024-06-14 DIAGNOSIS — C50411 Malignant neoplasm of upper-outer quadrant of right female breast: Secondary | ICD-10-CM | POA: Diagnosis not present

## 2024-06-14 LAB — RAD ONC ARIA SESSION SUMMARY
Course Elapsed Days: 34
Plan Fractions Treated to Date: 16
Plan Prescribed Dose Per Fraction: 2.66 Gy
Plan Total Fractions Prescribed: 16
Plan Total Prescribed Dose: 42.56 Gy
Reference Point Dosage Given to Date: 42.56 Gy
Reference Point Session Dosage Given: 2.66 Gy
Session Number: 16

## 2024-06-15 ENCOUNTER — Other Ambulatory Visit: Payer: Self-pay

## 2024-06-15 ENCOUNTER — Ambulatory Visit

## 2024-06-15 ENCOUNTER — Ambulatory Visit
Admission: RE | Admit: 2024-06-15 | Discharge: 2024-06-15 | Disposition: A | Discharge status: Home / Self Care | Source: Ambulatory Visit | Attending: Radiation Oncology | Admitting: Radiation Oncology

## 2024-06-15 DIAGNOSIS — Z510 Encounter for antineoplastic radiation therapy: Secondary | ICD-10-CM | POA: Diagnosis not present

## 2024-06-15 DIAGNOSIS — Z1721 Progesterone receptor positive status: Secondary | ICD-10-CM | POA: Diagnosis not present

## 2024-06-15 DIAGNOSIS — C50411 Malignant neoplasm of upper-outer quadrant of right female breast: Secondary | ICD-10-CM | POA: Diagnosis not present

## 2024-06-15 DIAGNOSIS — Z170 Estrogen receptor positive status [ER+]: Secondary | ICD-10-CM | POA: Diagnosis not present

## 2024-06-15 LAB — RAD ONC ARIA SESSION SUMMARY
Course Elapsed Days: 35
Plan Fractions Treated to Date: 1
Plan Prescribed Dose Per Fraction: 2 Gy
Plan Total Fractions Prescribed: 5
Plan Total Prescribed Dose: 10 Gy
Reference Point Dosage Given to Date: 2 Gy
Reference Point Session Dosage Given: 2 Gy
Session Number: 17

## 2024-06-16 ENCOUNTER — Ambulatory Visit

## 2024-06-16 ENCOUNTER — Ambulatory Visit
Admission: RE | Admit: 2024-06-16 | Discharge: 2024-06-16 | Discharge status: Home / Self Care | Source: Ambulatory Visit | Attending: Radiation Oncology | Admitting: Radiation Oncology

## 2024-06-16 ENCOUNTER — Other Ambulatory Visit: Payer: Self-pay

## 2024-06-16 DIAGNOSIS — C50411 Malignant neoplasm of upper-outer quadrant of right female breast: Secondary | ICD-10-CM | POA: Diagnosis not present

## 2024-06-16 DIAGNOSIS — Z170 Estrogen receptor positive status [ER+]: Secondary | ICD-10-CM | POA: Diagnosis not present

## 2024-06-16 DIAGNOSIS — Z510 Encounter for antineoplastic radiation therapy: Secondary | ICD-10-CM | POA: Diagnosis not present

## 2024-06-16 DIAGNOSIS — Z1721 Progesterone receptor positive status: Secondary | ICD-10-CM | POA: Diagnosis not present

## 2024-06-16 LAB — RAD ONC ARIA SESSION SUMMARY
Course Elapsed Days: 36
Plan Fractions Treated to Date: 2
Plan Prescribed Dose Per Fraction: 2 Gy
Plan Total Fractions Prescribed: 5
Plan Total Prescribed Dose: 10 Gy
Reference Point Dosage Given to Date: 4 Gy
Reference Point Session Dosage Given: 2 Gy
Session Number: 18

## 2024-06-17 ENCOUNTER — Ambulatory Visit
Admission: RE | Admit: 2024-06-17 | Discharge: 2024-06-17 | Disposition: A | Discharge status: Home / Self Care | Source: Ambulatory Visit | Attending: Radiation Oncology | Admitting: Radiation Oncology

## 2024-06-17 ENCOUNTER — Ambulatory Visit

## 2024-06-17 ENCOUNTER — Other Ambulatory Visit: Payer: Self-pay

## 2024-06-17 DIAGNOSIS — Z510 Encounter for antineoplastic radiation therapy: Secondary | ICD-10-CM | POA: Diagnosis not present

## 2024-06-17 DIAGNOSIS — C50411 Malignant neoplasm of upper-outer quadrant of right female breast: Secondary | ICD-10-CM | POA: Diagnosis not present

## 2024-06-17 DIAGNOSIS — Z1721 Progesterone receptor positive status: Secondary | ICD-10-CM | POA: Diagnosis not present

## 2024-06-17 DIAGNOSIS — Z170 Estrogen receptor positive status [ER+]: Secondary | ICD-10-CM | POA: Diagnosis not present

## 2024-06-17 LAB — RAD ONC ARIA SESSION SUMMARY
Course Elapsed Days: 37
Plan Fractions Treated to Date: 3
Plan Prescribed Dose Per Fraction: 2 Gy
Plan Total Fractions Prescribed: 5
Plan Total Prescribed Dose: 10 Gy
Reference Point Dosage Given to Date: 6 Gy
Reference Point Session Dosage Given: 2 Gy
Session Number: 19

## 2024-06-20 ENCOUNTER — Ambulatory Visit

## 2024-06-20 ENCOUNTER — Ambulatory Visit
Admission: RE | Admit: 2024-06-20 | Discharge: 2024-06-20 | Disposition: A | Discharge status: Home / Self Care | Source: Ambulatory Visit | Attending: Radiation Oncology | Admitting: Radiation Oncology

## 2024-06-20 ENCOUNTER — Other Ambulatory Visit: Payer: Self-pay

## 2024-06-20 DIAGNOSIS — Z510 Encounter for antineoplastic radiation therapy: Secondary | ICD-10-CM | POA: Diagnosis not present

## 2024-06-20 DIAGNOSIS — Z170 Estrogen receptor positive status [ER+]: Secondary | ICD-10-CM | POA: Diagnosis not present

## 2024-06-20 DIAGNOSIS — Z1721 Progesterone receptor positive status: Secondary | ICD-10-CM | POA: Diagnosis not present

## 2024-06-20 DIAGNOSIS — C50411 Malignant neoplasm of upper-outer quadrant of right female breast: Secondary | ICD-10-CM | POA: Diagnosis not present

## 2024-06-20 LAB — RAD ONC ARIA SESSION SUMMARY
Course Elapsed Days: 40
Plan Fractions Treated to Date: 4
Plan Prescribed Dose Per Fraction: 2 Gy
Plan Total Fractions Prescribed: 5
Plan Total Prescribed Dose: 10 Gy
Reference Point Dosage Given to Date: 8 Gy
Reference Point Session Dosage Given: 2 Gy
Session Number: 20

## 2024-06-21 ENCOUNTER — Other Ambulatory Visit: Payer: Self-pay

## 2024-06-21 ENCOUNTER — Ambulatory Visit
Admission: RE | Admit: 2024-06-21 | Discharge: 2024-06-21 | Disposition: A | Discharge status: Home / Self Care | Source: Ambulatory Visit | Attending: Oncology | Admitting: Oncology

## 2024-06-21 ENCOUNTER — Ambulatory Visit
Admission: RE | Admit: 2024-06-21 | Discharge: 2024-06-21 | Disposition: A | Discharge status: Home / Self Care | Source: Ambulatory Visit | Attending: Radiation Oncology | Admitting: Radiation Oncology

## 2024-06-21 ENCOUNTER — Ambulatory Visit

## 2024-06-21 DIAGNOSIS — Z1721 Progesterone receptor positive status: Secondary | ICD-10-CM | POA: Diagnosis not present

## 2024-06-21 DIAGNOSIS — Z510 Encounter for antineoplastic radiation therapy: Secondary | ICD-10-CM | POA: Diagnosis not present

## 2024-06-21 DIAGNOSIS — Z780 Asymptomatic menopausal state: Secondary | ICD-10-CM | POA: Insufficient documentation

## 2024-06-21 DIAGNOSIS — Z13820 Encounter for screening for osteoporosis: Secondary | ICD-10-CM | POA: Insufficient documentation

## 2024-06-21 DIAGNOSIS — Z170 Estrogen receptor positive status [ER+]: Secondary | ICD-10-CM | POA: Diagnosis not present

## 2024-06-21 DIAGNOSIS — C50919 Malignant neoplasm of unspecified site of unspecified female breast: Secondary | ICD-10-CM | POA: Insufficient documentation

## 2024-06-21 DIAGNOSIS — C50411 Malignant neoplasm of upper-outer quadrant of right female breast: Secondary | ICD-10-CM | POA: Diagnosis not present

## 2024-06-21 LAB — RAD ONC ARIA SESSION SUMMARY
Course Elapsed Days: 41
Plan Fractions Treated to Date: 5
Plan Prescribed Dose Per Fraction: 2 Gy
Plan Total Fractions Prescribed: 5
Plan Total Prescribed Dose: 10 Gy
Reference Point Dosage Given to Date: 10 Gy
Reference Point Session Dosage Given: 2 Gy
Session Number: 21

## 2024-06-22 ENCOUNTER — Ambulatory Visit

## 2024-06-22 NOTE — Radiation Completion Notes (Signed)
 Patient Name: Pamela Reid, Pamela Reid MRN: [id number] Date of Birth: 1956-11-27 Referring Physician: ZELPHIA CAP, M.D. Date of Service: 2024-06-22 Radiation Oncologist: Marcey Penton, M.D. [facility]                             RADIATION ONCOLOGY END OF TREATMENT NOTE     Diagnosis: C50.411 Malignant neoplasm of upper-outer quadrant of right female breast Staging on 2024-03-28: Invasive carcinoma of breast (HCC) T=cT1a, N=cN0, M=cM0 Intent: Curative     HPI: Patient is a 68 year old female who presented with an abnormal mammogram of her right breast.  There are actually 2 lesions 1 a 5 mm spiculated mass in the 11 o'clock position as well as a 9 mm group of coarse heterogeneous calcifications with low suspicions for malignancy.  Both were biopsied.  The 5 mm spiculated mass was positive for invasive mammary carcinoma.  Ultrasound of her axilla was normal.  She went on to have a wide local excision showing a 0.7 cm grade 1 invasive ductal carcinoma with all margins clear at 0.5 cm.  Tumor was ER positive PR positive and HER2/neu not overexpressed.  Patient's Oncotype recurrence score was 20 she will not receive systemic treatment.  She tolerated her surgery well and is basically without complaint specifically denies breast tenderness cough or bone pain she is seen today for radiation oncology opinion.        ==========DELIVERED PLANS==========  First Treatment Date: 2024-05-11 Last Treatment Date: 2024-06-21   Plan Name: Breast_R Site: Breast, Right Technique: 3D Mode: Photon Dose Per Fraction: 2.66 Gy Prescribed Dose (Delivered / Prescribed): 42.56 Gy / 42.56 Gy Prescribed Fxs (Delivered / Prescribed): 16 / 16   Plan Name: Breast_R_Bst Site: Breast, Right Technique: 3D Mode: Photon Dose Per Fraction: 2 Gy Prescribed Dose (Delivered / Prescribed): 10 Gy / 10 Gy Prescribed Fxs (Delivered / Prescribed): 5 / 5     ==========ON TREATMENT VISIT  DATES========== 2024-05-17, 2024-05-24, 2024-06-07, 2024-06-14, 2024-06-21     ==========UPCOMING VISITS==========       ==========APPENDIX - ON TREATMENT VISIT NOTES==========   See weekly On Treatment Notes in Epic for details in the Media tab (listed as Progress notes on the On Treatment Visit Dates listed above).

## 2024-06-23 ENCOUNTER — Ambulatory Visit

## 2024-06-24 ENCOUNTER — Ambulatory Visit

## 2024-06-27 ENCOUNTER — Encounter: Payer: Self-pay | Admitting: *Deleted

## 2024-06-27 ENCOUNTER — Ambulatory Visit

## 2024-06-28 ENCOUNTER — Ambulatory Visit

## 2024-06-29 ENCOUNTER — Ambulatory Visit

## 2024-06-30 ENCOUNTER — Ambulatory Visit

## 2024-07-01 ENCOUNTER — Ambulatory Visit

## 2024-07-04 ENCOUNTER — Ambulatory Visit

## 2024-07-05 ENCOUNTER — Ambulatory Visit

## 2024-07-05 ENCOUNTER — Ambulatory Visit: Admitting: Oncology

## 2024-07-06 ENCOUNTER — Ambulatory Visit

## 2024-07-08 ENCOUNTER — Ambulatory Visit: Admitting: Oncology

## 2024-07-11 ENCOUNTER — Encounter: Payer: Self-pay | Admitting: Oncology

## 2024-07-12 ENCOUNTER — Inpatient Hospital Stay: Admitting: Oncology

## 2024-07-19 ENCOUNTER — Ambulatory Visit: Payer: Self-pay

## 2024-07-19 VITALS — Ht 63.0 in | Wt 232.0 lb

## 2024-07-19 DIAGNOSIS — Z0000 Encounter for general adult medical examination without abnormal findings: Secondary | ICD-10-CM | POA: Diagnosis not present

## 2024-07-19 NOTE — Progress Notes (Signed)
 Subjective:   Pamela Reid is a 68 y.o. who presents for a Medicare Wellness preventive visit.  As a reminder, Annual Wellness Visits don't include a physical exam, and some assessments may be limited, especially if this visit is performed virtually. We may recommend an in-person follow-up visit with your provider if needed.  Visit Complete: Virtual I connected with  Pamela Reid on 07/19/24 by a audio enabled telemedicine application and verified that I am speaking with the correct person using two identifiers.  Patient Location: Home  Provider Location: Office/Clinic  I discussed the limitations of evaluation and management by telemedicine. The patient expressed understanding and agreed to proceed.  Vital Signs: Because this visit was a virtual/telehealth visit, some criteria may be missing or patient reported. Any vitals not documented were not able to be obtained and vitals that have been documented are patient reported.  VideoDeclined- This patient declined Librarian, academic. Therefore the visit was completed with audio only.  Persons Participating in Visit: Patient.  AWV Questionnaire: Yes: Patient Medicare AWV questionnaire was completed by the patient on 07/15/24; I have confirmed that all information answered by patient is correct and no changes since this date.  Cardiac Risk Factors include: advanced age (>55men, >22 women);dyslipidemia;hypertension;obesity (BMI >30kg/m2)     Objective:    Today's Vitals   07/19/24 1426  Weight: 232 lb (105.2 kg)  Height: 5' 3 (1.6 m)   Body mass index is 41.1 kg/m.     07/19/2024    2:35 PM 05/03/2024    8:41 AM 04/07/2024    6:58 AM 03/28/2024    9:42 AM 07/14/2023    3:20 PM 07/02/2022    8:50 AM 09/11/2021    6:55 AM  Advanced Directives  Does Patient Have a Medical Advance Directive? Yes Yes Yes No;Yes Yes No Yes  Type of Estate agent of [place];Living will  Healthcare Power  of [place];Living will Healthcare Power of [place];Living will Healthcare Power of [place];Living will  Healthcare Power of [place];Living will  Does patient want to make changes to medical advance directive? No - Patient declined  No - Patient declined  No - Patient declined    Copy of Healthcare Power of Attorney in Chart? No - copy requested  No - copy requested No - copy requested No - copy requested    Would patient like information on creating a medical advance directive?      No - Patient declined     Current Medications (verified) Outpatient Encounter Medications as of 07/19/2024  Medication Sig   Aspirin 81 MG CAPS Take by mouth.   Calcium  Polycarbophil (FIBER) 625 MG TABS Take by mouth.   co-enzyme Q-10 30 MG capsule Take by mouth.   esomeprazole (NEXIUM) 20 MG capsule Take by mouth.   ibuprofen (ADVIL) 200 MG tablet Take by mouth.   ketoconazole  (NIZORAL ) 2 % shampoo SHAMPOO INTO THE SCALP LET SIT 5 TO 10 MINUTES THEN WASH OFF USE  3 DAYS PER WEEK (Patient taking differently: SHAMPOO INTO THE SCALP LET SIT 5 TO 10 MINUTES THEN WASH OFF USE  3 DAYS PER WEEK or PRN)   losartan -hydrochlorothiazide (HYZAAR) 100-25 MG tablet Take 1 tablet by mouth daily.   mometasone  (ELOCON ) 0.1 % lotion APPLY TOPICALLY DAILY AS NEEDED UP TO 5 DAYS PER WEEK (Patient taking differently: APPLY TOPICALLY DAILY AS NEEDED UP TO 5 DAYS PER WEEK Taking PRN)   Multiple Vitamin (MULTI-VITAMINS) TABS Take 1 tablet by mouth  daily.   nebivolol  (BYSTOLIC ) 2.5 MG tablet TAKE 1 TABLET DAILY BY MOUTH FOR HYPERTENSION   rosuvastatin  (CRESTOR ) 20 MG tablet Take 1 tablet (20 mg total) by mouth daily.   Cholecalciferol 75 MCG (3000 UT) TABS Take by mouth. (Patient not taking: Reported on 07/19/2024)   No facility-administered encounter medications on file as of 07/19/2024.    Allergies (verified) Cephalexin and Penicillins   History: Past Medical History:  Diagnosis Date   Allergy    Breast cancer (HCC) 03-23-24    Cancer [facility])    Family history of breast cancer    Family history of melanoma    GERD (gastroesophageal reflux disease)    Heart murmur as a child - mitral valve   Hypertension    Lynch syndrome    Thyroid  disease Thyroid  biopsy for tumor 2013   Uterine cancer (HCC) 2015   Past Surgical History:  Procedure Laterality Date   ABDOMINAL HYSTERECTOMY     BREAST BIOPSY Left 2019   stereotatic bx neg   BREAST BIOPSY Right 03/22/2024   Stereo bx, ribbon clip, path pending   BREAST BIOPSY Right 03/22/2024   MM RT BREAST BX W LOC DEV 1ST LESION IMAGE BX SPEC STEREO GUIDE 03/22/2024 [facility]   BREAST BIOPSY Right 03/22/2024   US  RT BREAST BX W LOC DEV 1ST LESION IMG BX SPEC US  GUIDE 03/22/2024 [facility]   BREAST BIOPSY  04/05/2024   US  RT RADIOACTIVE SEED LOC 04/05/2024 [facility]   BREAST BIOPSY Right 04/05/2024   US  RT RADIOACTIVE SEED EA ADD LESION 04/05/2024 [facility]   BREAST LUMPECTOMY WITH RADIOACTIVE SEED LOCALIZATION Right 04/07/2024   Procedure: BREAST LUMPECTOMY WITH RADIOACTIVE SEED LOCALIZATION;  Surgeon: Ebbie Cough, MD;  Location: [facility];  Service: General;  Laterality: Right;   CHOLECYSTECTOMY     COLON SURGERY  2017   COLONOSCOPY WITH PROPOFOL  N/A 09/11/2021   Procedure: COLONOSCOPY WITH PROPOFOL ;  Surgeon: Unk Corinn Skiff, MD;  Location: [facility];  Service: Gastroenterology;  Laterality: N/A;   TUBAL LIGATION  1986   Family History  Problem Relation Age of Onset   Hypertension Mother    Melanoma Mother    Asthma Mother    Hyperlipidemia Mother    Parkinson's disease Father    Dementia Father    Hyperlipidemia Father    Hypertension Father    Healthy Sister    Healthy Brother    Breast cancer Maternal Aunt        dx 77s   Melanoma Paternal Aunt    Cancer Maternal Grandmother        GI cancer dx 21s   Cancer Maternal Grandfather        GI cancer dx 44s   Breast cancer Paternal Grandmother         dx 44s   Cancer Paternal Grandmother    Parkinson's disease Paternal Grandfather    Healthy Daughter    Healthy Son    Cancer Maternal Aunt    Social History   Socioeconomic History   Marital status: Married    Spouse name: Oneil   Number of children: 2   Years of education: Not on file   Highest education level: Professional school degree (e.g., MD, DDS, DVM, JD)  Occupational History   Occupation: part time consulting & teaching  Tobacco Use   Smoking status: Never   Smokeless tobacco: Never  Vaping Use   Vaping status: Never Used  Substance and Sexual Activity   Alcohol use:  Yes    Alcohol/week: 1.0 standard drink of alcohol    Types: 1 Glasses of wine per week    Comment: 1 glass of wine monthly or less   Drug use: Never   Sexual activity: Yes    Birth control/protection: Post-menopausal  Other Topics Concern   Not on file  Social History Narrative   Married, 2 children and 7 grandchildren   Social Drivers of Corporate investment banker Strain: Low Risk  (07/19/2024)   Overall Financial Resource Strain (CARDIA)    Difficulty of Paying Living Expenses: Not hard at all  Food Insecurity: No Food Insecurity (07/19/2024)   Hunger Vital Sign    Worried About Running Out of Food in the Last Year: Never true    Ran Out of Food in the Last Year: Never true  Transportation Needs: No Transportation Needs (07/19/2024)   PRAPARE - Administrator, Civil Service (Medical): No    Lack of Transportation (Non-Medical): No  Physical Activity: Sufficiently Active (07/19/2024)   Exercise Vital Sign    Days of Exercise per Week: 3 days    Minutes of Exercise per Session: 60 min  Stress: No Stress Concern Present (07/19/2024)   [employer] of Occupational Health - Occupational Stress Questionnaire    Feeling of Stress: Not at all  Social Connections: Moderately Isolated (07/19/2024)   Social Connection and Isolation Panel    Frequency of Communication with Friends and  Family: More than three times a week    Frequency of Social Gatherings with Friends and Family: Twice a week    Attends Religious Services: Never    Database administrator or Organizations: No    Attends Engineer, structural: Never    Marital Status: Married    Tobacco Counseling Counseling given: Not Answered    Clinical Intake:  Pre-visit preparation completed: Yes  Pain : No/denies pain     BMI - recorded: 41.1 Nutritional Status: BMI > 30  Obese Nutritional Risks: None  Lab Results  Component Value Date   HGBA1C 5.6 09/01/2023     How often do you need to have someone help you when you read instructions, pamphlets, or other written materials from your doctor or pharmacy?: 1 - Never  Interpreter Needed?: No  Information entered by :: Vina Ned, CMA   Activities of Daily Living     07/19/2024    2:28 PM 07/15/2024   11:24 AM  In your present state of health, do you have any difficulty performing the following activities:  Hearing? 0 0  Vision? 0 0  Difficulty concentrating or making decisions? 0 0  Walking or climbing stairs? 0 0  Dressing or bathing? 0 0  Doing errands, shopping? 0 0  Preparing Food and eating ? N N  Using the Toilet? N N  In the past six months, have you accidently leaked urine? N N  Do you have problems with loss of bowel control? N N  Managing your Medications? N N  Managing your Finances? N N  Housekeeping or managing your Housekeeping? N N    Patient Care Team: Valerio Melanie DASEN, NP as PCP - General (Nurse Practitioner) Hester Alm BROCKS, MD (Dermatology) Unk Corinn Skiff, MD as Consulting Physician (Gastroenterology) Babara Call, MD as Consulting Physician (Oncology) Lenn Aran, MD as Consulting Physician (Radiation Oncology) Lucio Franky PARAS, OD (Optometry)  I have updated your Care Teams any recent Medical Services you may have received from other providers in  the past year.     Assessment:   This is a  routine wellness examination for Pamela Reid.  Hearing/Vision screen Hearing Screening - Comments:: Denies hearing loss  Vision Screening - Comments:: Gets routine eye exams, Dr. Franky Corporal, [place]   Goals Addressed             This Visit's Progress    Patient Stated       Continue healthy habits to lose more weight       Depression Screen     07/19/2024    2:33 PM 02/19/2024    9:46 AM 09/01/2023    8:25 AM 07/14/2023    3:19 PM 01/23/2023    8:18 AM 07/23/2022    8:11 AM 07/02/2022    8:37 AM  PHQ 2/9 Scores  PHQ - 2 Score 0 1 0 0 0 0 0  PHQ- 9 Score 0 2 0 0 1 3     Fall Risk     07/19/2024    2:36 PM 07/15/2024   11:24 AM 02/19/2024    9:46 AM 09/01/2023    8:25 AM 07/10/2023   12:17 PM  Fall Risk   Falls in the past year? 0 0 0 0 0  Number falls in past yr: 0  0 0 0  Injury with Fall? 0  0 0 0  Risk for fall due to : No Fall Risks  No Fall Risks No Fall Risks No Fall Risks  Follow up Falls evaluation completed  Falls evaluation completed Falls evaluation completed Falls prevention discussed    MEDICARE RISK AT HOME:  Medicare Risk at Home Any stairs in or around the home?: Yes If so, are there any without handrails?: No Home free of loose throw rugs in walkways, pet beds, electrical cords, etc?: Yes Adequate lighting in your home to reduce risk of falls?: Yes Life alert?: No Use of a cane, walker or w/c?: No Grab bars in the bathroom?: Yes Shower chair or bench in shower?: Yes Elevated toilet seat or a handicapped toilet?: No  TIMED UP AND GO:  Was the test performed?  No  Cognitive Function: 6CIT completed        07/19/2024    2:37 PM 07/14/2023    3:22 PM 07/02/2022    9:47 AM  6CIT Screen  What Year? 0 points 0 points 0 points  What month? 0 points 0 points 0 points  What time? 0 points 0 points 0 points  Count back from 20 0 points 0 points 0 points  Months in reverse 0 points 0 points 0 points  Repeat phrase 0 points 0 points 0 points  Total  Score 0 points 0 points 0 points    Immunizations Immunization History  Administered Date(s) Administered   Fluad Quad(high Dose 65+) 08/25/2022   Influenza Split 08/25/2018, 09/08/2019   Influenza, High Dose Seasonal PF 08/15/2021, 08/17/2023   Influenza,inj,Quad PF,6+ Mos 08/30/2017   Influenza,inj,quad, With Preservative 10/14/2010   PFIZER Comirnaty(Gray Top)Covid-19 Tri-Sucrose Vaccine 03/11/2021, 08/25/2022   PFIZER(Purple Top)SARS-COV-2 Vaccination 12/12/2019, 01/02/2020, 08/27/2020, 03/11/2021, 04/23/2022   PPD Test 10/13/2019, 09/14/2020   Pfizer Covid-19 Vaccine Bivalent Booster 90yrs & up 08/15/2021, 04/23/2022   Pfizer(Comirnaty)Fall Seasonal Vaccine 12 years and older 08/25/2022, 02/23/2023, 08/17/2023, 04/18/2024   Pneumococcal Conjugate-13 07/22/2021   Pneumococcal Polysaccharide-23 07/23/2022   Tdap 12/31/2011, 09/14/2020   Zoster Recombinant(Shingrix) 12/23/2020, 03/11/2021   Zoster, Live 02/06/2014    Screening Tests Health Maintenance  Topic Date Due   COVID-19  Vaccine (12 - 2024-25 season) 06/13/2024   INFLUENZA VACCINE  07/01/2024   Colonoscopy  09/11/2024   MAMMOGRAM  02/22/2025   Medicare Annual Wellness (AWV)  07/19/2025   DTaP/Tdap/Td (3 - Td or Tdap) 09/14/2030   DEXA SCAN  06/21/2034   Pneumococcal Vaccine: 50+ Years  Completed   Hepatitis C Screening  Completed   Zoster Vaccines- Shingrix  Completed   HPV VACCINES  Aged Out   Meningococcal B Vaccine  Aged Out   Pneumococcal Vaccine  Discontinued    Health Maintenance  Health Maintenance Due  Topic Date Due   COVID-19 Vaccine (12 - 2024-25 season) 06/13/2024   INFLUENZA VACCINE  07/01/2024   Colonoscopy  09/11/2024   Health Maintenance Items Addressed: See Nurse Notes at the end of this note  Additional Screening:  Vision Screening: Recommended annual ophthalmology exams for early detection of glaucoma and other disorders of the eye. Would you like a referral to an eye doctor? No     Dental Screening: Recommended annual dental exams for proper oral hygiene  Community Resource Referral / Chronic Care Management: CRR required this visit?  No   CCM required this visit?  No   Plan:    I have personally reviewed and noted the following in the patient's chart:   Medical and social history Use of alcohol, tobacco or illicit drugs  Current medications and supplements including opioid prescriptions. Patient is not currently taking opioid prescriptions. Functional ability and status Nutritional status Physical activity Advanced directives List of other physicians Hospitalizations, surgeries, and ER visits in previous 12 months Vitals Screenings to include cognitive, depression, and falls Referrals and appointments  In addition, I have reviewed and discussed with patient certain preventive protocols, quality metrics, and best practice recommendations. A written personalized care plan for preventive services as well as general preventive health recommendations were provided to patient.   Vina Ned, CMA   07/19/2024   After Visit Summary: (MyChart) Due to this being a telephonic visit, the after visit summary with patients personalized plan was offered to patient via MyChart   Notes:  Flu shot at next OV on 09/07/24 Due for colonoscopy ~09/11/24. Gave ph# to Dr. Kayla office to follow up (Patient is currently being treated for breast cancer and will follow up for colonoscopy after her treatment is complete)

## 2024-07-19 NOTE — Patient Instructions (Signed)
 Ms. Pamela Reid , Thank you for taking time out of your busy schedule to complete your Annual Wellness Visit with me. I enjoyed our conversation and look forward to speaking with you again next year. I, as well as your care team,  appreciate your ongoing commitment to your health goals. Please review the following plan we discussed and let me know if I can assist you in the future. Your Game plan/ To Do List    Referrals: None   Follow up Visits: We will see or speak with you next year for your Next Medicare AWV with our clinical staff Have you seen your provider in the last 6 months (3 months if uncontrolled diabetes)? Yes  Clinician Recommendations: Get the flu shot at your next OV on 09/07/24 or at your convenience. You will be due for a colonoscopy 09/11/24. Call Dr. Kayla office at [facility] (ph# [phone]) to follow up after you finish treatment for breast cancer.  Aim for 30 minutes of exercise or brisk walking, 6-8 glasses of water, and 5 servings of fruits and vegetables each day.       This is a list of the screenings recommended for you:  Health Maintenance  Topic Date Due   COVID-19 Vaccine (12 - 2024-25 season) 06/13/2024   Flu Shot  07/01/2024   Colon Cancer Screening  09/11/2024   Mammogram  02/22/2025   Medicare Annual Wellness Visit  07/19/2025   DTaP/Tdap/Td vaccine (3 - Td or Tdap) 09/14/2030   DEXA scan (bone density measurement)  06/21/2034   Pneumococcal Vaccine for age over 19  Completed   Hepatitis C Screening  Completed   Zoster (Shingles) Vaccine  Completed   HPV Vaccine  Aged Out   Meningitis B Vaccine  Aged Out   Pneumococcal Vaccine  Discontinued    Advanced directives: (Copy Requested) Please bring a copy of your health care power of attorney and living will to the office to be added to your chart at your convenience. You can mail to [facility] [address] [place], [place] [address] or email to [email or web address] Advance  Care Planning is important because it:  [x]  Makes sure you receive the medical care that is consistent with your values, goals, and preferences  [x]  It provides guidance to your family and loved ones and reduces their decisional burden about whether or not they are making the right decisions based on your wishes.  Follow the link provided in your after visit summary or read over the paperwork we have mailed to you to help you started getting your Advance Directives in place. If you need assistance in completing these, please reach out to us  so that we can help you!  See attachments for Preventive Care and Fall Prevention Tips.   Fall Prevention in the Home, Adult Falls can cause injuries and affect people of all ages. There are many simple things that you can do to make your home safe and to help prevent falls. If you need it, ask for help making these changes. What actions can I take to prevent falls? General information Use good lighting in all rooms. Make sure to: Replace any light bulbs that burn out. Turn on lights if it is dark and use night-lights. Keep items that you use often in easy-to-reach places. Lower the shelves around your home if needed. Move furniture so that there are clear paths around it. Do not keep throw rugs or other things on the floor that can make  you trip. If any of your floors are uneven, fix them. Add color or contrast paint or tape to clearly mark and help you see: Grab bars or handrails. First and last steps of staircases. Where the edge of each step is. If you use a ladder or stepladder: Make sure that it is fully opened. Do not climb a closed ladder. Make sure the sides of the ladder are locked in place. Have someone hold the ladder while you use it. Know where your pets are as you move through your home. What can I do in the bathroom?     Keep the floor dry. Clean up any water that is on the floor right away. Remove soap buildup in the bathtub or  shower. Buildup makes bathtubs and showers slippery. Use non-skid mats or decals on the floor of the bathtub or shower. Attach bath mats securely with double-sided, non-slip rug tape. If you need to sit down while you are in the shower, use a non-slip stool. Install grab bars by the toilet and in the bathtub and shower. Do not use towel bars as grab bars. What can I do in the bedroom? Make sure that you have a light by your bed that is easy to reach. Do not use any sheets or blankets on your bed that hang to the floor. Have a firm bench or chair with side arms that you can use for support when you get dressed. What can I do in the kitchen? Clean up any spills right away. If you need to reach something above you, use a sturdy step stool that has a grab bar. Keep electrical cables out of the way. Do not use floor polish or wax that makes floors slippery. What can I do with my stairs? Do not leave anything on the stairs. Make sure that you have a light switch at the top and the bottom of the stairs. Have them installed if you do not have them. Make sure that there are handrails on both sides of the stairs. Fix handrails that are broken or loose. Make sure that handrails are as long as the staircases. Install non-slip stair treads on all stairs in your home if they do not have carpet. Avoid having throw rugs at the top or bottom of stairs, or secure the rugs with carpet tape to prevent them from moving. Choose a carpet design that does not hide the edge of steps on the stairs. Make sure that carpet is firmly attached to the stairs. Fix any carpet that is loose or worn. What can I do on the outside of my home? Use bright outdoor lighting. Repair the edges of walkways and driveways and fix any cracks. Clear paths of anything that can make you trip, such as tools or rocks. Add color or contrast paint or tape to clearly mark and help you see high doorway thresholds. Trim any bushes or trees on the  main path into your home. Check that handrails are securely fastened and in good repair. Both sides of all steps should have handrails. Install guardrails along the edges of any raised decks or porches. Have leaves, snow, and ice cleared regularly. Use sand, salt, or ice melt on walkways during winter months if you live where there is ice and snow. In the garage, clean up any spills right away, including grease or oil spills. What other actions can I take? Review your medicines with your health care provider. Some medicines can make you confused or feel dizzy.  This can increase your chance of falling. Wear closed-toe shoes that fit well and support your feet. Wear shoes that have rubber soles and low heels. Use a cane, walker, scooter, or crutches that help you move around if needed. Talk with your provider about other ways that you can decrease your risk of falls. This may include seeing a physical therapist to learn to do exercises to improve movement and strength. Where to find more information Centers for Disease Control and Prevention, STEADI: TonerPromos.no [employer] on Aging: BaseRingTones.pl National Institute on Aging: BaseRingTones.pl Contact a health care provider if: You are afraid of falling at home. You feel weak, drowsy, or dizzy at home. You fall at home. Get help right away if you: Lose consciousness or have trouble moving after a fall. Have a fall that causes a head injury. These symptoms may be an emergency. Get help right away. Call 911. Do not wait to see if the symptoms will go away. Do not drive yourself to the hospital. This information is not intended to replace advice given to you by your health care provider. Make sure you discuss any questions you have with your health care provider. Document Revised: 07/21/2022 Document Reviewed: 07/21/2022 Elsevier Patient Education  2024 [employer].

## 2024-07-21 ENCOUNTER — Ambulatory Visit
Admission: RE | Admit: 2024-07-21 | Discharge: 2024-07-21 | Disposition: A | Discharge status: Home / Self Care | Source: Ambulatory Visit | Attending: Radiation Oncology | Admitting: Radiation Oncology

## 2024-07-21 ENCOUNTER — Encounter: Payer: Self-pay | Admitting: Radiation Oncology

## 2024-07-21 VITALS — BP 126/85 | HR 73 | Temp 97.0°F | Resp 16 | Wt 240.0 lb

## 2024-07-21 DIAGNOSIS — C50411 Malignant neoplasm of upper-outer quadrant of right female breast: Secondary | ICD-10-CM | POA: Insufficient documentation

## 2024-07-21 NOTE — Progress Notes (Signed)
 Radiation Oncology Follow up Note  Name: Pamela Reid   Date:   07/21/2024 MRN:  [id number] DOB: 12/30/1955    This 68 y.o. female presents to the clinic today for 1 month follow-up status post whole breast radiation for stage Ia (pT1b NX M0) ER positive invasive mammary carcinoma status post wide local excision and sentinel biopsy.  REFERRING PROVIDER: Valerio Melanie DASEN, NP  HPI: Patient is a 68 year old female now out 1 month having completed whole breast radiation to her right breast for ER positive stage Ia invasive mammary carcinoma.  Seen today in routine follow-up she is doing well.  She specifically denies breast tenderness cough or bone pain.  She has not yet started an endocrine therapy has an appointment with medical oncology in the next couple of weeks..  COMPLICATIONS OF TREATMENT: none  FOLLOW UP COMPLIANCE: keeps appointments   PHYSICAL EXAM:  BP 126/85   Pulse 73   Temp (!) 97 F (36.1 C)   Resp 16   Wt 240 lb (108.9 kg)   BMI 42.51 kg/m  Lungs are clear to A&P cardiac examination essentially unremarkable with regular rate and rhythm. No dominant mass or nodularity is noted in either breast in 2 positions examined. Incision is well-healed. No axillary or supraclavicular adenopathy is appreciated. Cosmetic result is excellent.  Well-developed well-nourished patient in NAD. HEENT reveals PERLA, EOMI, discs not visualized.  Oral cavity is clear. No oral mucosal lesions are identified. Neck is clear without evidence of cervical or supraclavicular adenopathy. Lungs are clear to A&P. Cardiac examination is essentially unremarkable with regular rate and rhythm without murmur rub or thrill. Abdomen is benign with no organomegaly or masses noted. Motor sensory and DTR levels are equal and symmetric in the upper and lower extremities. Cranial nerves II through XII are grossly intact. Proprioception is intact. No peripheral adenopathy or edema is identified. No motor or sensory levels  are noted. Crude visual fields are within normal range.  RADIOLOGY RESULTS: No current films for review  PLAN: Present time patient is doing well extremely low side effect profile from her whole breast radiation.  Am pleased with her overall progress.  She will be seeing medical oncology in the next several weeks to discuss endocrine therapy.  I have asked to see her back in 6 months for follow-up.  Patient knows to call with any concerns.  I would like to take this opportunity to thank you for allowing me to participate in the care of your patient.SABRA Marcey Penton, MD

## 2024-08-02 ENCOUNTER — Inpatient Hospital Stay: Attending: Oncology | Admitting: Oncology

## 2024-08-02 ENCOUNTER — Encounter: Payer: Self-pay | Admitting: Oncology

## 2024-08-02 VITALS — BP 134/68 | HR 71 | Temp 96.6°F | Resp 19 | Wt 238.7 lb

## 2024-08-02 DIAGNOSIS — Z1731 Human epidermal growth factor receptor 2 positive status: Secondary | ICD-10-CM | POA: Diagnosis not present

## 2024-08-02 DIAGNOSIS — C50411 Malignant neoplasm of upper-outer quadrant of right female breast: Secondary | ICD-10-CM | POA: Diagnosis not present

## 2024-08-02 DIAGNOSIS — C50919 Malignant neoplasm of unspecified site of unspecified female breast: Secondary | ICD-10-CM | POA: Diagnosis not present

## 2024-08-02 DIAGNOSIS — Z79811 Long term (current) use of aromatase inhibitors: Secondary | ICD-10-CM | POA: Insufficient documentation

## 2024-08-02 DIAGNOSIS — Z923 Personal history of irradiation: Secondary | ICD-10-CM | POA: Diagnosis not present

## 2024-08-02 DIAGNOSIS — Z170 Estrogen receptor positive status [ER+]: Secondary | ICD-10-CM | POA: Diagnosis not present

## 2024-08-02 DIAGNOSIS — Z90710 Acquired absence of both cervix and uterus: Secondary | ICD-10-CM | POA: Insufficient documentation

## 2024-08-02 DIAGNOSIS — Z808 Family history of malignant neoplasm of other organs or systems: Secondary | ICD-10-CM | POA: Insufficient documentation

## 2024-08-02 DIAGNOSIS — Z800 Family history of malignant neoplasm of digestive organs: Secondary | ICD-10-CM | POA: Insufficient documentation

## 2024-08-02 DIAGNOSIS — Z803 Family history of malignant neoplasm of breast: Secondary | ICD-10-CM | POA: Insufficient documentation

## 2024-08-02 DIAGNOSIS — Z1721 Progesterone receptor positive status: Secondary | ICD-10-CM | POA: Insufficient documentation

## 2024-08-02 DIAGNOSIS — Z8542 Personal history of malignant neoplasm of other parts of uterus: Secondary | ICD-10-CM | POA: Insufficient documentation

## 2024-08-02 MED ORDER — ANASTROZOLE 1 MG PO TABS: 1.0000 mg | ORAL_TABLET | Freq: Every day | ORAL | 3 refills | Status: DC

## 2024-08-02 NOTE — Assessment & Plan Note (Addendum)
 Stage I right breast invasive carcinoma. pT1b pNx, ER 100%, PR 40%, HER2 low (1+) Ki67 7% Pathology results were reviewed with patient.  Oncotype Dx 20, <1% chemotherapy benefit. Will not offer adjuvant chemotherapy.  Recommend adjuvant radiation.  06/21/24 Normal DEXA Recommend aromatase inhibitor  Rationale of using aromatase inhibitor -Arimidex   discussed with patient.  Side effects of Arimidex  including but not limited to hot flash, muscle and joint pain, fatigue, mood swing, vaginal dryness/inching, loss of bone mineral density,hair thinning, heart problem, etc were discussed with patient.  Patient voices understanding and willing to proceed.

## 2024-08-02 NOTE — Progress Notes (Signed)
 Hematology/Oncology Progress note [phone] [phone]        REFERRING PROVIDER: Valerio Melanie DASEN, NP    CHIEF COMPLAINTS/PURPOSE OF CONSULTATION:  Stage I right breast invasive carcinoma  ASSESSMENT & PLAN:   Invasive carcinoma of breast (HCC) Stage I right breast invasive carcinoma. pT1b pNx, ER 100%, PR 40%, HER2 low (1+) Ki67 7% Pathology results were reviewed with patient.  Oncotype Dx 20, <1% chemotherapy benefit. Will not offer adjuvant chemotherapy.  Recommend adjuvant radiation.  06/21/24 Normal DEXA Recommend aromatase inhibitor  Rationale of using aromatase inhibitor -Arimidex   discussed with patient.  Side effects of Arimidex  including but not limited to hot flash, muscle and joint pain, fatigue, mood swing, vaginal dryness/inching, loss of bone mineral density,hair thinning, heart problem, etc were discussed with patient.  Patient voices understanding and willing to proceed.     Aromatase inhibitor use Recommend calcium  and vitamin D supplementation.    Orders Placed This Encounter  Procedures   CBC with Differential (Cancer Center Only)    Standing Status:   Future    Expected Date:   11/01/2024    Expiration Date:   01/30/2025   CMP (Cancer Center only)    Standing Status:   Future    Expected Date:   11/01/2024    Expiration Date:   01/30/2025   Follow-up to be determined. All questions were answered. The patient knows to call the clinic with any problems, questions or concerns.  Zelphia Cap, MD, PhD [facility] Hematology Oncology 08/02/2024    HISTORY OF PRESENTING ILLNESS:  Pamela Reid 68 y.o. female presents to establish care for stage I right breast invasive ductal carcinoma. I have reviewed her chart and materials related to her cancer extensively and collaborated history with the patient. Summary of oncologic history is as follows: Oncology History  Invasive carcinoma of breast (HCC)  02/23/2024 Mammogram   Bilateral  screening mammogram  In the right breast, possible distortion and separate calcifications warrant further evaluation. In the left breast, no findings suspicious for malignancy.   03/09/2024 Mammogram   Bilateral diagnostic mammogram w US  1. RIGHT breast 5 mm spiculated mass in the 11 o'clock position is intermediate suspicion for malignancy. Recommend further assessment with ultrasound-guided biopsy. 2. RIGHT breast 9 mm group of coarse heterogeneous calcification is low suspicion for malignancy. Recommend further assessment with stereotactic guided biopsy.  Targeted right axillary ultrasound demonstrates multiple morphologically benign lymph nodes. No lymphadenopathy   03/28/2024 Initial Diagnosis   Invasive carcinoma of breast [employer])  Patient underwent right breast biopsy.  1. Breast, right, needle core biopsy, 11 o'clock (coil) :       INVASIVE DUCTAL CARCINOMA       TUBULE FORMATION: SCORE 2       NUCLEAR PLEOMORPHISM: SCORE 2       MITOTIC COUNT: SCORE 1       TOTAL SCORE: 5       OVERALL GRADE: 1       LYMPHOVASCULAR INVASION: NOT IDENTIFIED       CANCER LENGTH: 5 MM       CALCIFICATIONS: PRESENT       OTHER FINDINGS: NONE       ER 100%+, PR 40%+ HER2 low (1+)        2. Breast, right, needle core biopsy, upper outer quadrant (ribbon) :       BENIGN BREAST TISSUE WITH FIBROADENOMATOUS CHANGES AND CALCIFICATIONS.       NEGATIVE FOR ATYPIA OR MALIGNANCY.  03/28/2024 Cancer Staging   Staging form: Breast, AJCC 8th Edition - Clinical stage from 03/28/2024: Stage IA (cT1a, cN0, cM0, G1, ER+, PR+, HER2-) - Signed by Babara Call, MD on 03/28/2024 Stage prefix: Initial diagnosis Histologic grading system: 3 grade system   04/07/2024 Surgery   Patient status post right breast lumpectomy Pathology showed  A. BREAST, RIGHT, LUMPECTOMY: - Invasive ductal carcinoma, 0.7 cm, grade 1 - Ductal carcinoma in situ: Not identified - Margins, invasive: All margins negative for invasive  carcinoma     Closest, invasive: Posterior, 0.5 cm - Margins, DCIS: All margins negative for ductal carcinoma in situ     Closest, DCIS: Not applicable - Lymphovascular invasion: Not identified - Prognostic markers:  ER 100%, positive, strong staining intensity, PR 40%, positive, moderate to strong staining intensity, Her2 negative, Ki-67 7% - Other: Biopsy site changes present - See oncology table  ONCOLOGY TABLE:  INVASIVE CARCINOMA OF THE BREAST:  Resection  Procedure: Excision Specimen Laterality: Right Histologic Type: Invasive ductal carcinoma Histologic Grade:      Glandular (Acinar)/Tubular Differentiation: 2      Nuclear Pleomorphism: 2      Mitotic Rate: 1      Overall Grade: 1 Tumor Size: 7 mm Ductal Carcinoma In Situ: Not identified Lymphatic and/or Vascular Invasion: Not identified Treatment Effect in the Breast: No known presurgical therapy Margins: All margins negative for invasive carcinoma      Distance from Closest Margin (mm): 5 mm      Specify Closest Margin (required only if <2mm): Posterior DCIS Margins: Uninvolved by DCIS      Distance from Closest Margin (mm): Not applicable      Specify Closest Margin (required only if <70mm): Not applicable Regional Lymph Nodes: Not applicable      Distant Metastasis:      Distant Site(s) Involved: Not applicable Breast Biomarker Testing Performed on Previous Biopsy:      Testing Performed on Case Number: [id number] ER 100%, positive, strong staining intensity, PR 40% positive, moderate to strong staining intensity, Her2 negative, Ki-67 7% Pathologic Stage Classification (pTNM, AJCC 8th Edition):  pT1b, pNx Representative Tumor Block: A2     05/11/2024 - 06/21/2024 Radiation Therapy   S/p adjvuant breast radiation.    Patient has a history of early-stage uterine cancer status post total hysterectomy.  Strong family history of GI cancer.  She has had genetic testing done which showed PMS2 pathology of  mutation-Lynch syndrome. Positive for family history of breast cancer.  Patient presents for follow up after radiation.  She has no new complaints.   MEDICAL HISTORY:  Past Medical History:  Diagnosis Date   Allergy    Breast cancer (HCC) 03-23-24   Cancer [facility])    Family history of breast cancer    Family history of melanoma    GERD (gastroesophageal reflux disease)    Heart murmur as a child - mitral valve   Hypertension    Lynch syndrome    Thyroid  disease Thyroid  biopsy for tumor 2013   Uterine cancer (HCC) 2015    SURGICAL HISTORY: Past Surgical History:  Procedure Laterality Date   ABDOMINAL HYSTERECTOMY     BREAST BIOPSY Left 2019   stereotatic bx neg   BREAST BIOPSY Right 03/22/2024   Stereo bx, ribbon clip, path pending   BREAST BIOPSY Right 03/22/2024   MM RT BREAST BX W LOC DEV 1ST LESION IMAGE BX SPEC STEREO GUIDE 03/22/2024 [facility]   BREAST BIOPSY Right 03/22/2024  US  RT BREAST BX W LOC DEV 1ST LESION IMG BX SPEC US  GUIDE 03/22/2024 [facility]   BREAST BIOPSY  04/05/2024   US  RT RADIOACTIVE SEED LOC 04/05/2024 [facility]   BREAST BIOPSY Right 04/05/2024   US  RT RADIOACTIVE SEED EA ADD LESION 04/05/2024 [facility]   BREAST LUMPECTOMY WITH RADIOACTIVE SEED LOCALIZATION Right 04/07/2024   Procedure: BREAST LUMPECTOMY WITH RADIOACTIVE SEED LOCALIZATION;  Surgeon: Ebbie Cough, MD;  Location: [facility];  Service: General;  Laterality: Right;   CHOLECYSTECTOMY     COLON SURGERY  2017   COLONOSCOPY WITH PROPOFOL  N/A 09/11/2021   Procedure: COLONOSCOPY WITH PROPOFOL ;  Surgeon: Unk Corinn Skiff, MD;  Location: [facility];  Service: Gastroenterology;  Laterality: N/A;   TUBAL LIGATION  1986    SOCIAL HISTORY: Social History   Socioeconomic History   Marital status: Married    Spouse name: Oneil   Number of children: 2   Years of education: Not on file   Highest education level: Professional school degree (e.g.,  MD, DDS, DVM, JD)  Occupational History   Occupation: part time consulting & teaching  Tobacco Use   Smoking status: Never   Smokeless tobacco: Never  Vaping Use   Vaping status: Never Used  Substance and Sexual Activity   Alcohol use: Yes    Alcohol/week: 1.0 standard drink of alcohol    Types: 1 Glasses of wine per week    Comment: 1 glass of wine monthly or less   Drug use: Never   Sexual activity: Yes    Birth control/protection: Post-menopausal  Other Topics Concern   Not on file  Social History Narrative   Married, 2 children and 7 grandchildren   Social Drivers of Corporate investment banker Strain: Low Risk  (07/19/2024)   Overall Financial Resource Strain (CARDIA)    Difficulty of Paying Living Expenses: Not hard at all  Food Insecurity: No Food Insecurity (07/19/2024)   Hunger Vital Sign    Worried About Running Out of Food in the Last Year: Never true    Ran Out of Food in the Last Year: Never true  Transportation Needs: No Transportation Needs (07/19/2024)   PRAPARE - Administrator, Civil Service (Medical): No    Lack of Transportation (Non-Medical): No  Physical Activity: Sufficiently Active (07/19/2024)   Exercise Vital Sign    Days of Exercise per Week: 3 days    Minutes of Exercise per Session: 60 min  Stress: No Stress Concern Present (07/19/2024)   [employer] of Occupational Health - Occupational Stress Questionnaire    Feeling of Stress: Not at all  Social Connections: Moderately Isolated (07/19/2024)   Social Connection and Isolation Panel    Frequency of Communication with Friends and Family: More than three times a week    Frequency of Social Gatherings with Friends and Family: Twice a week    Attends Religious Services: Never    Database administrator or Organizations: No    Attends Banker Meetings: Never    Marital Status: Married  Catering manager Violence: Not At Risk (07/19/2024)   Humiliation, Afraid, Rape, and  Kick questionnaire    Fear of Current or Ex-Partner: No    Emotionally Abused: No    Physically Abused: No    Sexually Abused: No    FAMILY HISTORY: Family History  Problem Relation Age of Onset   Hypertension Mother    Melanoma Mother    Asthma Mother  Hyperlipidemia Mother    Parkinson's disease Father    Dementia Father    Hyperlipidemia Father    Hypertension Father    Healthy Sister    Healthy Brother    Breast cancer Maternal Aunt        dx 67s   Melanoma Paternal Aunt    Cancer Maternal Grandmother        GI cancer dx 87s   Cancer Maternal Grandfather        GI cancer dx 74s   Breast cancer Paternal Grandmother        dx 48s   Cancer Paternal Grandmother    Parkinson's disease Paternal Grandfather    Healthy Daughter    Healthy Son    Cancer Maternal Aunt     ALLERGIES:  is allergic to cephalexin and penicillins.  MEDICATIONS:  Current Outpatient Medications  Medication Sig Dispense Refill   anastrozole  (ARIMIDEX ) 1 MG tablet Take 1 tablet (1 mg total) by mouth daily. 30 tablet 3   Aspirin 81 MG CAPS Take by mouth.     Calcium  Polycarbophil (FIBER) 625 MG TABS Take by mouth.     Cholecalciferol 75 MCG (3000 UT) TABS Take by mouth.     co-enzyme Q-10 30 MG capsule Take by mouth.     esomeprazole (NEXIUM) 20 MG capsule Take by mouth.     ibuprofen (ADVIL) 200 MG tablet Take by mouth.     ketoconazole  (NIZORAL ) 2 % shampoo SHAMPOO INTO THE SCALP LET SIT 5 TO 10 MINUTES THEN WASH OFF USE  3 DAYS PER WEEK (Patient taking differently: SHAMPOO INTO THE SCALP LET SIT 5 TO 10 MINUTES THEN WASH OFF USE  3 DAYS PER WEEK or PRN) 120 mL 2   losartan -hydrochlorothiazide (HYZAAR) 100-25 MG tablet Take 1 tablet by mouth daily. 90 tablet 4   mometasone  (ELOCON ) 0.1 % lotion APPLY TOPICALLY DAILY AS NEEDED UP TO 5 DAYS PER WEEK (Patient taking differently: APPLY TOPICALLY DAILY AS NEEDED UP TO 5 DAYS PER WEEK Taking PRN) 120 mL 2   Multiple Vitamin (MULTI-VITAMINS) TABS  Take 1 tablet by mouth daily.     nebivolol  (BYSTOLIC ) 2.5 MG tablet TAKE 1 TABLET DAILY BY MOUTH FOR HYPERTENSION 90 tablet 0   rosuvastatin  (CRESTOR ) 20 MG tablet Take 1 tablet (20 mg total) by mouth daily. 90 tablet 4   No current facility-administered medications for this visit.    Review of Systems  Constitutional:  Negative for appetite change, chills, fatigue and fever.  HENT:   Negative for hearing loss and voice change.   Eyes:  Negative for eye problems.  Respiratory:  Negative for chest tightness and cough.   Cardiovascular:  Negative for chest pain.  Gastrointestinal:  Negative for abdominal distention, abdominal pain and blood in stool.  Endocrine: Negative for hot flashes.  Genitourinary:  Negative for difficulty urinating and frequency.   Musculoskeletal:  Negative for arthralgias.  Skin:  Negative for itching and rash.  Neurological:  Negative for extremity weakness.  Hematological:  Negative for adenopathy.  Psychiatric/Behavioral:  Negative for confusion.      PHYSICAL EXAMINATION: ECOG PERFORMANCE STATUS: 0 - Asymptomatic  Vitals:   08/02/24 1300  BP: 134/68  Pulse: 71  Resp: 19  Temp: (!) 96.6 F (35.9 C)  SpO2: 98%   Filed Weights   08/02/24 1300  Weight: 238 lb 11.2 oz (108.3 kg)    Physical Exam Constitutional:      General: She is not in acute distress.  Appearance: She is obese. She is not diaphoretic.  HENT:     Head: Normocephalic.  Eyes:     General: No scleral icterus. Cardiovascular:     Rate and Rhythm: Normal rate and regular rhythm.     Heart sounds: No murmur heard. Pulmonary:     Effort: Pulmonary effort is normal. No respiratory distress.     Breath sounds: No wheezing.  Abdominal:     General: There is no distension.     Palpations: Abdomen is soft.     Tenderness: There is no abdominal tenderness.  Musculoskeletal:        General: Normal range of motion.     Cervical back: Normal range of motion and neck supple.   Skin:    General: Skin is warm and dry.     Findings: No erythema.  Neurological:     Mental Status: She is alert and oriented to person, place, and time. Mental status is at baseline.     Motor: No abnormal muscle tone.  Psychiatric:        Mood and Affect: Mood and affect normal.      LABORATORY DATA:  I have reviewed the data as listed    Latest Ref Rng & Units 05/16/2024    2:25 PM 03/28/2024   10:21 AM 09/01/2023    8:58 AM  CBC  WBC 4.0 - 10.5 K/uL 9.6  7.3  6.8   Hemoglobin 12.0 - 15.0 g/dL 86.5  86.5  86.2   Hematocrit 36.0 - 46.0 % 39.8  40.2  42.7   Platelets 150 - 400 K/uL 286  253  274       Latest Ref Rng & Units 03/28/2024   10:21 AM 02/19/2024   10:18 AM 09/01/2023    8:58 AM  CMP  Glucose 70 - 99 mg/dL 878  890  891   BUN 8 - 23 mg/dL 8  9  11    Creatinine 0.44 - 1.00 mg/dL 9.37  9.35  9.36   Sodium 135 - 145 mmol/L 135  140  140   Potassium 3.5 - 5.1 mmol/L 3.6  4.0  3.6   Chloride 98 - 111 mmol/L 99  97  97   CO2 22 - 32 mmol/L 24  26  24    Calcium  8.9 - 10.3 mg/dL 9.3  9.8  9.8   Total Protein 6.5 - 8.1 g/dL 7.3  7.4  7.2   Total Bilirubin 0.0 - 1.2 mg/dL 0.7  0.6  0.6   Alkaline Phos 38 - 126 U/L 122  139  143   AST 15 - 41 U/L 21  37  19   ALT 0 - 44 U/L 33  32  27      RADIOGRAPHIC STUDIES: I have personally reviewed the radiological images as listed and agreed with the findings in the report. No results found.

## 2024-08-02 NOTE — Assessment & Plan Note (Signed)
 Recommend calcium and vitamin D supplementation.

## 2024-08-02 NOTE — Progress Notes (Signed)
 Survivorship Care Plan visit completed.  Treatment summary reviewed and given to patient.  ASCO answers booklet reviewed and given to patient.  CARE program and Cancer Transitions discussed with patient along with other resources cancer center offers to patients and caregivers.  Patient verbalized understanding.

## 2024-08-15 ENCOUNTER — Other Ambulatory Visit: Payer: Self-pay | Admitting: Nurse Practitioner

## 2024-08-16 NOTE — Telephone Encounter (Signed)
 Requested Prescriptions  Pending Prescriptions Disp Refills   losartan -hydrochlorothiazide (HYZAAR) 100-25 MG tablet [Pharmacy Med Name: Losartan  Potassium-HCTZ 100-25 MG Oral Tablet] 100 tablet 2    Sig: TAKE 1 TABLET BY MOUTH DAILY     Cardiovascular: ARB + Diuretic Combos Passed - 08/16/2024 12:35 PM      Passed - K in normal range and within 180 days    Potassium  Date Value Ref Range Status  03/28/2024 3.6 3.5 - 5.1 mmol/L Final         Passed - Na in normal range and within 180 days    Sodium  Date Value Ref Range Status  03/28/2024 135 135 - 145 mmol/L Final  02/19/2024 140 134 - 144 mmol/L Final         Passed - Cr in normal range and within 180 days    Creatinine, Ser  Date Value Ref Range Status  03/28/2024 0.62 0.44 - 1.00 mg/dL Final         Passed - eGFR is 10 or above and within 180 days    GFR, Estimated  Date Value Ref Range Status  03/28/2024 >60 >60 mL/min Final    Comment:    (NOTE) Calculated using the CKD-EPI Creatinine Equation (2021)    eGFR  Date Value Ref Range Status  02/19/2024 96 >59 mL/min/1.73 Final         Passed - Patient is not pregnant      Passed - Last BP in normal range    BP Readings from Last 1 Encounters:  08/02/24 134/68         Passed - Valid encounter within last 6 months    Recent Outpatient Visits           5 months ago Class 3 severe obesity due to excess calories without serious comorbidity with body mass index (BMI) of 45.0 to 49.9 in adult [facility])   [place] [facility] Valerio Melanie DASEN, NP

## 2024-09-01 ENCOUNTER — Other Ambulatory Visit: Payer: Self-pay | Admitting: Nurse Practitioner

## 2024-09-01 ENCOUNTER — Encounter: Payer: Self-pay | Admitting: *Deleted

## 2024-09-02 NOTE — Telephone Encounter (Signed)
 Requested by interface surescripts. Medication dose discontinued 09/02/23.  Requested Prescriptions  Refused Prescriptions Disp Refills   rosuvastatin  (CRESTOR ) 10 MG tablet [Pharmacy Med Name: Rosuvastatin  Calcium  10 MG Oral Tablet] 100 tablet 2    Sig: TAKE 1 TABLET BY MOUTH DAILY     Cardiovascular:  Antilipid - Statins 2 Failed - 09/02/2024  1:30 PM      Failed - Lipid Panel in normal range within the last 12 months    Cholesterol, Total  Date Value Ref Range Status  02/19/2024 164 100 - 199 mg/dL Final   LDL Chol Calc (NIH)  Date Value Ref Range Status  02/19/2024 67 0 - 99 mg/dL Final   HDL  Date Value Ref Range Status  02/19/2024 80 >39 mg/dL Final   Triglycerides  Date Value Ref Range Status  02/19/2024 91 0 - 149 mg/dL Final         Passed - Cr in normal range and within 360 days    Creatinine, Ser  Date Value Ref Range Status  03/28/2024 0.62 0.44 - 1.00 mg/dL Final         Passed - Patient is not pregnant      Passed - Valid encounter within last 12 months    Recent Outpatient Visits           6 months ago Class 3 severe obesity due to excess calories without serious comorbidity with body mass index (BMI) of 45.0 to 49.9 in adult [facility])   [facility] [place], Melanie DASEN, NP

## 2024-09-03 NOTE — Patient Instructions (Signed)
 Be Involved in Caring For Your Health:  Taking Medications When medications are taken as directed, they can greatly improve your health. But if they are not taken as prescribed, they may not work. In some cases, not taking them correctly can be harmful. To help ensure your treatment remains effective and safe, understand your medications and how to take them. Bring your medications to each visit for review by your provider.  Your lab results, notes, and after visit summary will be available on My Chart. We strongly encourage you to use this feature. If lab results are abnormal the clinic will contact you with the appropriate steps. If the clinic does not contact you assume the results are satisfactory. You can always view your results on My Chart. If you have questions regarding your health or results, please contact the clinic during office hours. You can also ask questions on My Chart.  We at [facility] are grateful that you chose us  to provide your care. We strive to provide evidence-based and compassionate care and are always looking for feedback. If you get a survey from the clinic please complete this so we can hear your opinions.  Healthy Eating, Adult Healthy eating may help you get and keep a healthy body weight, reduce the risk of chronic disease, and live a long and productive life. It is important to follow a healthy eating pattern. Your nutritional and calorie needs should be met mainly by different nutrient-rich foods. What are tips for following this plan? Reading food labels Read labels and choose the following: Reduced or low sodium products. Juices with 100% fruit juice. Foods with low saturated fats (<3 g per serving) and high polyunsaturated and monounsaturated fats. Foods with whole grains, such as whole wheat, cracked wheat, brown rice, and wild rice. Whole grains that are fortified with folic acid. This is recommended for females who are pregnant or who want to  become pregnant. Read labels and do not eat or drink the following: Foods or drinks with added sugars. These include foods that contain brown sugar, corn sweetener, corn syrup, dextrose , fructose, glucose, high-fructose corn syrup, honey, invert sugar, lactose, malt syrup, maltose, molasses, raw sugar, sucrose, trehalose, or turbinado sugar. Limit your intake of added sugars to less than 10% of your total daily calories. Do not eat more than the following amounts of added sugar per day: 6 teaspoons (25 g) for females. 9 teaspoons (38 g) for males. Foods that contain processed or refined starches and grains. Refined grain products, such as white flour, degermed cornmeal, white bread, and white rice. Shopping Choose nutrient-rich snacks, such as vegetables, whole fruits, and nuts. Avoid high-calorie and high-sugar snacks, such as potato chips, fruit snacks, and candy. Use oil-based dressings and spreads on foods instead of solid fats such as butter, margarine, sour cream, or cream cheese. Limit pre-made sauces, mixes, and instant products such as flavored rice, instant noodles, and ready-made pasta. Try more plant-protein sources, such as tofu, tempeh, black beans, edamame, lentils, nuts, and seeds. Explore eating plans such as the Mediterranean diet or vegetarian diet. Try heart-healthy dips made with beans and healthy fats like hummus and guacamole. Vegetables go great with these. Cooking Use oil to saut or stir-fry foods instead of solid fats such as butter, margarine, or lard. Try baking, boiling, grilling, or broiling instead of frying. Remove the fatty part of meats before cooking. Steam vegetables in water  or broth. Meal planning  At meals, imagine dividing your plate into fourths: One-half of  your plate is fruits and vegetables. One-fourth of your plate is whole grains. One-fourth of your plate is protein, especially lean meats, poultry, eggs, tofu, beans, or nuts. Include low-fat  dairy as part of your daily diet. Lifestyle Choose healthy options in all settings, including home, work, school, restaurants, or stores. Prepare your food safely: Wash your hands after handling raw meats. Where you prepare food, keep surfaces clean by regularly washing with hot, soapy water . Keep raw meats separate from ready-to-eat foods, such as fruits and vegetables. Cook seafood, meat, poultry, and eggs to the recommended temperature. Get a food thermometer. Store foods at safe temperatures. In general: Keep cold foods at 84F (4.4C) or below. Keep hot foods at 184F (60C) or above. Keep your freezer at [facility] (-17.8C) or below. Foods are not safe to eat if they have been between the temperatures of 40-184F (4.4-60C) for more than 2 hours. What foods should I eat? Fruits Aim to eat 1-2 cups of fresh, canned (in natural juice), or frozen fruits each day. One cup of fruit equals 1 small apple, 1 large banana, 8 large strawberries, 1 cup (237 g) canned fruit,  cup (82 g) dried fruit, or 1 cup (240 mL) 100% juice. Vegetables Aim to eat 2-4 cups of fresh and frozen vegetables each day, including different varieties and colors. One cup of vegetables equals 1 cup (91 g) broccoli or cauliflower florets, 2 medium carrots, 2 cups (150 g) raw, leafy greens, 1 large tomato, 1 large bell pepper, 1 large sweet potato, or 1 medium white potato. Grains Aim to eat 5-10 ounce-equivalents of whole grains each day. Examples of 1 ounce-equivalent of grains include 1 slice of bread, 1 cup (40 g) ready-to-eat cereal, 3 cups (24 g) popcorn, or  cup (93 g) cooked rice. Meats and other proteins Try to eat 5-7 ounce-equivalents of protein each day. Examples of 1 ounce-equivalent of protein include 1 egg,  oz nuts (12 almonds, 24 pistachios, or 7 walnut halves), 1/4 cup (90 g) cooked beans, 6 tablespoons (90 g) hummus or 1 tablespoon (16 g) peanut butter. A cut of meat or fish that is the size of a deck of  cards is about 3-4 ounce-equivalents (85 g). Of the protein you eat each week, try to have at least 8 sounce (227 g) of seafood. This is about 2 servings per week. This includes salmon, trout, herring, sardines, and anchovies. Dairy Aim to eat 3 cup-equivalents of fat-free or low-fat dairy each day. Examples of 1 cup-equivalent of dairy include 1 cup (240 mL) milk, 8 ounces (250 g) yogurt, 1 ounces (44 g) natural cheese, or 1 cup (240 mL) fortified soy milk. Fats and oils Aim for about 5 teaspoons (21 g) of fats and oils per day. Choose monounsaturated fats, such as canola and olive oils, mayonnaise made with olive oil or avocado oil, avocados, peanut butter, and most nuts, or polyunsaturated fats, such as sunflower, corn, and soybean oils, walnuts, pine nuts, sesame seeds, sunflower seeds, and flaxseed. Beverages Aim for 6 eight-ounce glasses of water  per day. Limit coffee to 3-5 eight-ounce cups per day. Limit caffeinated beverages that have added calories, such as soda and energy drinks. If you drink alcohol: Limit how much you have to: 0-1 drink a day if you are female. 0-2 drinks a day if you are female. Know how much alcohol is in your drink. In the [place] one drink is one 12 oz bottle of beer (355 mL), one 5 oz glass of wine (  148 mL), or one 1 oz glass of hard liquor (44 mL). Seasoning and other foods Try not to add too much salt to your food. Try using herbs and spices instead of salt. Try not to add sugar to food. This information is based on [place] nutrition guidelines. To learn more, visit [employer]. Exact amounts may vary. You may need different amounts. This information is not intended to replace advice given to you by your health care provider. Make sure you discuss any questions you have with your health care provider. Document Revised: 08/18/2022 Document Reviewed: 08/18/2022 Elsevier Patient Education  2024 [employer].

## 2024-09-07 ENCOUNTER — Ambulatory Visit: Admitting: Nurse Practitioner

## 2024-09-07 ENCOUNTER — Encounter: Payer: Self-pay | Admitting: Nurse Practitioner

## 2024-09-07 VITALS — BP 131/72 | HR 65 | Temp 98.1°F | Resp 16 | Ht 62.99 in | Wt 241.6 lb

## 2024-09-07 DIAGNOSIS — E782 Mixed hyperlipidemia: Secondary | ICD-10-CM | POA: Diagnosis not present

## 2024-09-07 DIAGNOSIS — Z15060 Genetic susceptibility to colorectal cancer: Secondary | ICD-10-CM | POA: Diagnosis not present

## 2024-09-07 DIAGNOSIS — C50919 Malignant neoplasm of unspecified site of unspecified female breast: Secondary | ICD-10-CM | POA: Diagnosis not present

## 2024-09-07 DIAGNOSIS — Z1509 Genetic susceptibility to other malignant neoplasm: Secondary | ICD-10-CM

## 2024-09-07 DIAGNOSIS — E66813 Obesity, class 3: Secondary | ICD-10-CM

## 2024-09-07 DIAGNOSIS — I10 Essential (primary) hypertension: Secondary | ICD-10-CM | POA: Diagnosis not present

## 2024-09-07 DIAGNOSIS — Z1211 Encounter for screening for malignant neoplasm of colon: Secondary | ICD-10-CM

## 2024-09-07 DIAGNOSIS — Z1507 Genetic susceptibility to malignant neoplasm of urinary tract: Secondary | ICD-10-CM

## 2024-09-07 DIAGNOSIS — K219 Gastro-esophageal reflux disease without esophagitis: Secondary | ICD-10-CM | POA: Diagnosis not present

## 2024-09-07 DIAGNOSIS — Z6841 Body Mass Index (BMI) 40.0 and over, adult: Secondary | ICD-10-CM | POA: Diagnosis not present

## 2024-09-07 DIAGNOSIS — Z23 Encounter for immunization: Secondary | ICD-10-CM

## 2024-09-07 DIAGNOSIS — Z79811 Long term (current) use of aromatase inhibitors: Secondary | ICD-10-CM | POA: Diagnosis not present

## 2024-09-07 DIAGNOSIS — Z0000 Encounter for general adult medical examination without abnormal findings: Secondary | ICD-10-CM

## 2024-09-07 MED ORDER — LOSARTAN POTASSIUM-HCTZ 100-25 MG PO TABS: 1.0000 | ORAL_TABLET | Freq: Every day | ORAL | 4 refills | Status: AC

## 2024-09-07 MED ORDER — ROSUVASTATIN CALCIUM 20 MG PO TABS: 20.0000 mg | ORAL_TABLET | Freq: Every day | ORAL | 4 refills | Status: DC

## 2024-09-07 NOTE — Assessment & Plan Note (Signed)
 Chronic. Genetic testing on 10/16/21 performed. Continue to follow with GI for scheduled EGD and colonoscopies -- is due this year but wishes to hold off until January.  Amylase and Lipase + CMP at annual physical.

## 2024-09-07 NOTE — Assessment & Plan Note (Signed)
Ongoing with use of Nexium.  Continue to monitor and adjust regimen as needed.  Mag level annually.

## 2024-09-07 NOTE — Progress Notes (Signed)
 BP 131/72 (BP Location: Left Arm, Patient Position: Sitting, Cuff Size: Large)   Pulse 65   Temp 98.1 F (36.7 C) (Oral)   Resp 16   Ht 5' 2.99 (1.6 m)   Wt 241 lb 9.6 oz (109.6 kg)   SpO2 97%   BMI 42.81 kg/m    Subjective:    Patient ID: Pamela Reid, female    DOB: Feb 18, 1956, 68 y.o.   MRN: [id number]  HPI: Pamela Reid is a 68 y.o. female presenting on 09/07/2024 for comprehensive medical examination. Current medical complaints include:none  She currently lives with: husband Menopausal Symptoms: no  Has PMS2-related Lynch Syndrome. Completed radiation for breast cancer, right upper-outer, on 06/21/24.  Last oncology visit was on 08/02/24.  She is taking Arimidex , to maintain for 5 years. Continues Nexium for reflux issues.  HYPERTENSION without Chronic Kidney Disease Hypertension status: stable  Satisfied with current treatment? yes Duration of hypertension: chronic BP monitoring frequency:  daily BP range: low teens range at home and HR in the 50's BP medication side effects:  no Medication compliance: good compliance Aspirin: yes Recurrent headaches: no Visual changes: no Palpitations: no Dyspnea: no Chest pain: no Lower extremity edema: no Dizzy/lightheaded: no   HYPERLIPIDEMIA Hyperlipidemia status: good compliance Satisfied with current treatment?  yes Side effects:  no Medication compliance: good compliance Supplements: none Aspirin:  no The 10-year ASCVD risk score (Arnett DK, et al., 2019) is: 10.2%   Values used to calculate the score:     Age: 73 years     Clincally relevant sex: Female     Is Non-Hispanic African American: No     Diabetic: No     Tobacco smoker: No     Systolic Blood Pressure: 131 mmHg     Is BP treated: Yes     HDL Cholesterol: 80 mg/dL     Total Cholesterol: 164 mg/dL Chest pain:  no Coronary artery disease:  no Family history CAD:  yes Family history early CAD:  no   Outpatient Encounter Medications as of 09/07/2024   Medication Sig Note   anastrozole  (ARIMIDEX ) 1 MG tablet Take 1 tablet (1 mg total) by mouth daily.    Aspirin 81 MG CAPS Take by mouth.    Calcium  Polycarbophil (FIBER) 625 MG TABS Take by mouth.    Cholecalciferol 75 MCG (3000 UT) TABS Take by mouth.    co-enzyme Q-10 30 MG capsule Take by mouth.    esomeprazole (NEXIUM) 20 MG capsule Take by mouth. 07/14/2023: daily   ibuprofen (ADVIL) 200 MG tablet Take by mouth. 07/14/2023: prn   ketoconazole  (NIZORAL ) 2 % shampoo SHAMPOO INTO THE SCALP LET SIT 5 TO 10 MINUTES THEN WASH OFF USE  3 DAYS PER WEEK (Patient taking differently: SHAMPOO INTO THE SCALP LET SIT 5 TO 10 MINUTES THEN WASH OFF USE  3 DAYS PER WEEK or PRN)    mometasone  (ELOCON ) 0.1 % lotion APPLY TOPICALLY DAILY AS NEEDED UP TO 5 DAYS PER WEEK (Patient taking differently: APPLY TOPICALLY DAILY AS NEEDED UP TO 5 DAYS PER WEEK Taking PRN)    Multiple Vitamin (MULTI-VITAMINS) TABS Take 1 tablet by mouth daily.    [DISCONTINUED] losartan -hydrochlorothiazide (HYZAAR) 100-25 MG tablet TAKE 1 TABLET BY MOUTH DAILY    [DISCONTINUED] nebivolol  (BYSTOLIC ) 2.5 MG tablet TAKE 1 TABLET DAILY BY MOUTH FOR HYPERTENSION    [DISCONTINUED] rosuvastatin  (CRESTOR ) 20 MG tablet Take 1 tablet (20 mg total) by mouth daily.    losartan -hydrochlorothiazide (HYZAAR) 100-25  MG tablet Take 1 tablet by mouth daily.    rosuvastatin  (CRESTOR ) 20 MG tablet Take 1 tablet (20 mg total) by mouth daily.    No facility-administered encounter medications on file as of 09/07/2024.     Depression Screen done today and results listed below:     09/07/2024    8:01 AM 07/19/2024    2:33 PM 02/19/2024    9:46 AM 09/01/2023    8:25 AM 07/14/2023    3:19 PM  Depression screen PHQ 2/9  Decreased Interest 0 0 0 0 0  Down, Depressed, Hopeless 0 0 1 0 0  PHQ - 2 Score 0 0 1 0 0  Altered sleeping 0 0 0 0 0  Tired, decreased energy 0 0 0 0 0  Change in appetite 0 0 1 0 0  Feeling bad or failure about yourself  0 0 0 0 0   Trouble concentrating 0 0 0 0 0  Moving slowly or fidgety/restless 0 0 0 0 0  Suicidal thoughts 0 0 0 0 0  PHQ-9 Score 0 0 2 0 0  Difficult doing work/chores  Not difficult at all Not difficult at all Not difficult at all Not difficult at all      09/07/2024    8:01 AM 02/19/2024    9:46 AM 09/01/2023    8:26 AM 01/23/2023    8:19 AM  GAD 7 : Generalized Anxiety Score  Nervous, Anxious, on Edge 0 0 0 0  Control/stop worrying 0 0 0 0  Worry too much - different things 0 0 0 0  Trouble relaxing 0 0 0 0  Restless 0 0 0 0  Easily annoyed or irritable 0 0 0 0  Afraid - awful might happen 0 0 0 0  Total GAD 7 Score 0 0 0 0  Anxiety Difficulty Not difficult at all Not difficult at all        09/01/2023    8:25 AM 02/19/2024    9:46 AM 07/15/2024   11:24 AM 07/19/2024    2:36 PM 09/07/2024    8:01 AM  Fall Risk  Falls in the past year? 0 0 0 0 0  Was there an injury with Fall? 0 0  0 0  Fall Risk Category Calculator 0 0  0 0  Patient at Risk for Falls Due to No Fall Risks No Fall Risks  No Fall Risks No Fall Risks  Fall risk Follow up Falls evaluation completed Falls evaluation completed  Falls evaluation completed Falls evaluation completed      Functional Status Survey: Is the patient deaf or have difficulty hearing?: No Does the patient have difficulty seeing, even when wearing glasses/contacts?: No Does the patient have difficulty concentrating, remembering, or making decisions?: No Does the patient have difficulty walking or climbing stairs?: No Does the patient have difficulty dressing or bathing?: No Does the patient have difficulty doing errands alone such as visiting a doctor's office or shopping?: No   Past Medical History:  Past Medical History:  Diagnosis Date   Allergy    Breast cancer (HCC) 03-23-24   Cancer (HCC)    Family history of breast cancer    Family history of melanoma    GERD (gastroesophageal reflux disease)    Heart murmur as a child - mitral valve    Hyperlipidemia    Hypertension    Lynch syndrome    Thyroid  disease Thyroid  biopsy for tumor 2013   Uterine cancer (HCC) 2015  Surgical History:  Past Surgical History:  Procedure Laterality Date   ABDOMINAL HYSTERECTOMY     BREAST BIOPSY Left 2019   stereotatic bx neg   BREAST BIOPSY Right 03/22/2024   Stereo bx, ribbon clip, path pending   BREAST BIOPSY Right 03/22/2024   MM RT BREAST BX W LOC DEV 1ST LESION IMAGE BX SPEC STEREO GUIDE 03/22/2024 [facility]   BREAST BIOPSY Right 03/22/2024   US  RT BREAST BX W LOC DEV 1ST LESION IMG BX SPEC US  GUIDE 03/22/2024 [facility]   BREAST BIOPSY  04/05/2024   US  RT RADIOACTIVE SEED LOC 04/05/2024 [facility]   BREAST BIOPSY Right 04/05/2024   US  RT RADIOACTIVE SEED EA ADD LESION 04/05/2024 [facility]   BREAST LUMPECTOMY WITH RADIOACTIVE SEED LOCALIZATION Right 04/07/2024   Procedure: BREAST LUMPECTOMY WITH RADIOACTIVE SEED LOCALIZATION;  Surgeon: Ebbie Cough, MD;  Location: [facility];  Service: General;  Laterality: Right;   CHOLECYSTECTOMY     COLON SURGERY  2017   COLONOSCOPY WITH PROPOFOL  N/A 09/11/2021   Procedure: COLONOSCOPY WITH PROPOFOL ;  Surgeon: Unk Corinn Skiff, MD;  Location: [facility];  Service: Gastroenterology;  Laterality: N/A;   TUBAL LIGATION  1986    Medications:  Current Outpatient Medications on File Prior to Visit  Medication Sig   anastrozole  (ARIMIDEX ) 1 MG tablet Take 1 tablet (1 mg total) by mouth daily.   Aspirin 81 MG CAPS Take by mouth.   Calcium  Polycarbophil (FIBER) 625 MG TABS Take by mouth.   Cholecalciferol 75 MCG (3000 UT) TABS Take by mouth.   co-enzyme Q-10 30 MG capsule Take by mouth.   esomeprazole (NEXIUM) 20 MG capsule Take by mouth.   ibuprofen (ADVIL) 200 MG tablet Take by mouth.   ketoconazole  (NIZORAL ) 2 % shampoo SHAMPOO INTO THE SCALP LET SIT 5 TO 10 MINUTES THEN WASH OFF USE  3 DAYS PER WEEK (Patient taking differently: SHAMPOO INTO THE  SCALP LET SIT 5 TO 10 MINUTES THEN WASH OFF USE  3 DAYS PER WEEK or PRN)   mometasone  (ELOCON ) 0.1 % lotion APPLY TOPICALLY DAILY AS NEEDED UP TO 5 DAYS PER WEEK (Patient taking differently: APPLY TOPICALLY DAILY AS NEEDED UP TO 5 DAYS PER WEEK Taking PRN)   Multiple Vitamin (MULTI-VITAMINS) TABS Take 1 tablet by mouth daily.   No current facility-administered medications on file prior to visit.    Allergies:  Allergies  Allergen Reactions   Cephalexin Hives, Rash and Dermatitis    dermatitis  dermatitis dermatitis    dermatitis   Penicillins Hives, Rash and Dermatitis    dermatitis  dermatitis dermatitis    dermatitis    Social History:  Social History   Socioeconomic History   Marital status: Married    Spouse name: Oneil   Number of children: 2   Years of education: Not on file   Highest education level: Professional school degree (e.g., MD, DDS, DVM, JD)  Occupational History   Occupation: part time consulting & teaching  Tobacco Use   Smoking status: Never   Smokeless tobacco: Never  Vaping Use   Vaping status: Never Used  Substance and Sexual Activity   Alcohol use: Yes    Alcohol/week: 1.0 standard drink of alcohol    Types: 1 Glasses of wine per week    Comment: 1 glass of wine monthly or less   Drug use: Never   Sexual activity: Yes    Birth control/protection: Post-menopausal  Other Topics Concern   Not on file  Social History Narrative   Married, 2 children and 7 grandchildren   Social Drivers of Corporate investment banker Strain: Low Risk  (07/19/2024)   Overall Financial Resource Strain (CARDIA)    Difficulty of Paying Living Expenses: Not hard at all  Food Insecurity: No Food Insecurity (07/19/2024)   Hunger Vital Sign    Worried About Running Out of Food in the Last Year: Never true    Ran Out of Food in the Last Year: Never true  Transportation Needs: No Transportation Needs (07/19/2024)   PRAPARE - Administrator, Civil Service  (Medical): No    Lack of Transportation (Non-Medical): No  Physical Activity: Sufficiently Active (07/19/2024)   Exercise Vital Sign    Days of Exercise per Week: 3 days    Minutes of Exercise per Session: 60 min  Stress: No Stress Concern Present (07/19/2024)   [employer] of Occupational Health - Occupational Stress Questionnaire    Feeling of Stress: Not at all  Social Connections: Moderately Isolated (07/19/2024)   Social Connection and Isolation Panel    Frequency of Communication with Friends and Family: More than three times a week    Frequency of Social Gatherings with Friends and Family: Twice a week    Attends Religious Services: Never    Database administrator or Organizations: No    Attends Banker Meetings: Never    Marital Status: Married  Catering manager Violence: Not At Risk (07/19/2024)   Humiliation, Afraid, Rape, and Kick questionnaire    Fear of Current or Ex-Partner: No    Emotionally Abused: No    Physically Abused: No    Sexually Abused: No   Social History   Tobacco Use  Smoking Status Never  Smokeless Tobacco Never   Social History   Substance and Sexual Activity  Alcohol Use Yes   Alcohol/week: 1.0 standard drink of alcohol   Types: 1 Glasses of wine per week   Comment: 1 glass of wine monthly or less    Family History:  Family History  Problem Relation Age of Onset   Hypertension Mother    Melanoma Mother    Asthma Mother    Hyperlipidemia Mother    Parkinson's disease Father    Dementia Father    Hyperlipidemia Father    Hypertension Father    Healthy Sister    Healthy Brother    Breast cancer Maternal Aunt        dx 67s   Melanoma Paternal Aunt    Cancer Maternal Grandmother        GI cancer dx 28s   Cancer Maternal Grandfather        GI cancer dx 63s   Breast cancer Paternal Grandmother        dx 53s   Cancer Paternal Grandmother    Parkinson's disease Paternal Grandfather    Healthy Daughter    Healthy  Son    Cancer Maternal Aunt     Past medical history, surgical history, medications, allergies, family history and social history reviewed with patient today and changes made to appropriate areas of the chart.   ROS All other ROS negative except what is listed above and in the HPI.      Objective:    BP 131/72 (BP Location: Left Arm, Patient Position: Sitting, Cuff Size: Large)   Pulse 65   Temp 98.1 F (36.7 C) (Oral)   Resp 16   Ht 5' 2.99 (1.6 m)  Wt 241 lb 9.6 oz (109.6 kg)   SpO2 97%   BMI 42.81 kg/m   Wt Readings from Last 3 Encounters:  09/07/24 241 lb 9.6 oz (109.6 kg)  08/02/24 238 lb 11.2 oz (108.3 kg)  07/21/24 240 lb (108.9 kg)    Physical Exam Vitals and nursing note reviewed.  Constitutional:      General: She is awake. She is not in acute distress.    Appearance: She is well-developed. She is not ill-appearing.  HENT:     Head: Normocephalic and atraumatic.     Right Ear: Hearing, tympanic membrane, ear canal and external ear normal. No drainage.     Left Ear: Hearing, tympanic membrane, ear canal and external ear normal. No drainage.     Nose: Nose normal.     Right Sinus: No maxillary sinus tenderness or frontal sinus tenderness.     Left Sinus: No maxillary sinus tenderness or frontal sinus tenderness.     Mouth/Throat:     Mouth: Mucous membranes are moist.     Pharynx: Oropharynx is clear. Uvula midline. No pharyngeal swelling, oropharyngeal exudate or posterior oropharyngeal erythema.  Eyes:     General: Lids are normal.        Right eye: No discharge.        Left eye: No discharge.     Extraocular Movements: Extraocular movements intact.     Conjunctiva/sclera: Conjunctivae normal.     Pupils: Pupils are equal, round, and reactive to light.     Visual Fields: Right eye visual fields normal and left eye visual fields normal.  Neck:     Thyroid : No thyromegaly.     Vascular: No carotid bruit.     Trachea: Trachea normal.  Cardiovascular:      Rate and Rhythm: Normal rate and regular rhythm.     Heart sounds: Normal heart sounds. No murmur heard.    No gallop.  Pulmonary:     Effort: Pulmonary effort is normal. No accessory muscle usage or respiratory distress.     Breath sounds: Normal breath sounds.  Chest:     Comments: Deferred per patient request, has had multiple exams recently. Abdominal:     General: Bowel sounds are normal.     Palpations: Abdomen is soft. There is no hepatomegaly or splenomegaly.     Tenderness: There is no abdominal tenderness.  Musculoskeletal:        General: Normal range of motion.     Cervical back: Normal range of motion and neck supple.     Right lower leg: No edema.     Left lower leg: No edema.  Lymphadenopathy:     Head:     Right side of head: No submental, submandibular, tonsillar, preauricular or posterior auricular adenopathy.     Left side of head: No submental, submandibular, tonsillar, preauricular or posterior auricular adenopathy.     Cervical: No cervical adenopathy.  Skin:    General: Skin is warm and dry.     Capillary Refill: Capillary refill takes less than 2 seconds.     Findings: No rash.  Neurological:     Mental Status: She is alert and oriented to person, place, and time.     Gait: Gait is intact.     Deep Tendon Reflexes: Reflexes are normal and symmetric.     Reflex Scores:      Brachioradialis reflexes are 2+ on the right side and 2+ on the left side.  Patellar reflexes are 2+ on the right side and 2+ on the left side. Psychiatric:        Attention and Perception: Attention normal.        Mood and Affect: Mood normal.        Speech: Speech normal.        Behavior: Behavior normal. Behavior is cooperative.        Thought Content: Thought content normal.        Judgment: Judgment normal.    Results for orders placed or performed in visit on 06/21/24  Rad Onc [facility] Session Summary   Collection Time: 06/21/24  7:50 AM  Result Value Ref Range   Course  ID C1_Breast    Course Intent Curative    Course Start Date 05/04/2024    Session Number 21    Course First Treatment Date 05/11/2024  1:58 PM    Course Last Treatment Date 06/21/2024  7:49 AM    Course Elapsed Days 41    Reference Point ID Rt Breast Bst DP    Reference Point Dosage Given to Date 10 Gy   Reference Point Session Dosage Given 2 Gy   Plan ID Breast_R_Bst    Plan Fractions Treated to Date 5    Plan Total Fractions Prescribed 5    Plan Prescribed Dose Per Fraction 2 Gy   Plan Total Prescribed Dose 10.000000 Gy   Plan Primary Reference Point Rt Breast Bst DP       Assessment & Plan:   Problem List Items Addressed This Visit       Cardiovascular and Mediastinum   Hypertension, essential   Chronic, stable.  BP well below goal in office and on home checks.  Recommend she monitor BP at least a few mornings a week at home and document.  DASH diet at home. Will discontinue Bystolic  for now since home BP very stable and HR in low 50's at home.  Continue remainder of current medication regimen and adjust as needed.  Refills sent in.  Labs: CBC, TSH, CMP.  Return in 6 months.      Relevant Medications   rosuvastatin  (CRESTOR ) 20 MG tablet   losartan -hydrochlorothiazide (HYZAAR) 100-25 MG tablet   Other Relevant Orders   CBC with Differential/Platelet   TSH     Digestive   GERD (gastroesophageal reflux disease)   Ongoing with use of Nexium.  Continue to monitor and adjust regimen as needed.  Mag level annually.      Relevant Orders   Magnesium     Other   PMS2-related Lynch syndrome (HNPCC4) (Chronic)   Chronic. Genetic testing on 10/16/21 performed. Continue to follow with GI for scheduled EGD and colonoscopies -- is due this year but wishes to hold off until January.  Amylase and Lipase + CMP at annual physical.      Relevant Orders   Amylase   Lipase   Invasive carcinoma of breast (HCC) - Primary (Chronic)   Diagnosed 03/28/24.  Continue collaboration with oncology,  recent notes reviewed.      Aromatase inhibitor use (Chronic)   To be on for 5 years and is tolerating. Continue this treatment and collaboration with oncology.      Obesity   BMI 42.81.  Recommended eating smaller high protein, low fat meals more frequently and exercising 30 mins a day 5 times a week with a goal of 10-15lb weight loss in the next 3 months. Patient voiced their understanding and motivation to adhere to these  recommendations.       Mixed hyperlipidemia   Chronic, ongoing.  She is tolerating Rosuvastatin .  Recommend she continue this and we will adjust as needed.  Lipid panel and CMP today.      Relevant Medications   rosuvastatin  (CRESTOR ) 20 MG tablet   losartan -hydrochlorothiazide (HYZAAR) 100-25 MG tablet   Other Relevant Orders   Comprehensive metabolic panel with GFR   Lipid Panel w/o Chol/HDL Ratio   Other Visit Diagnoses       Flu vaccine need       Flu vaccine today, educated patient.   Relevant Orders   Flu vaccine HIGH DOSE PF(Fluzone Trivalent) (Completed)     Colon cancer screening       GI referral placed     Need for COVID-19 vaccine       Covid vaccine in office today.   Relevant Orders   Pfizer Comirnaty Covid-19 Vaccine 5yrs & older (Completed)     Encounter for annual physical exam       Annual physical today with labs and health maintenance reviewed, discussed with patient.        Follow up plan: Return in about 6 months (around 03/08/2025) for HTN/HLD, Breast CA.   LABORATORY TESTING:  - Pap smear: not applicable  IMMUNIZATIONS:   - Tdap: Tetanus vaccination status reviewed: last tetanus booster within 10 years. - Influenza: Up to date - provided today - Pneumovax: Up To Date - Prevnar: Up To Date - HPV: Not applicable - Zostavax vaccine: Up to date - Covid: Provided today  SCREENING: -Mammogram: Up To Date -- March 2025 - Colonoscopy: Up To Date - Bone Density: Up To Date -- July 2025 = normal -Hearing Test: Not  applicable  -Spirometry: Not applicable   PATIENT COUNSELING:   Advised to take 1 mg of folate supplement per day if capable of pregnancy.   Sexuality: Discussed sexually transmitted diseases, partner selection, use of condoms, avoidance of unintended pregnancy  and contraceptive alternatives.   Advised to avoid cigarette smoking.  I discussed with the patient that most people either abstain from alcohol or drink within safe limits (<=14/week and <=4 drinks/occasion for males, <=7/weeks and <= 3 drinks/occasion for females) and that the risk for alcohol disorders and other health effects rises proportionally with the number of drinks per week and how often a drinker exceeds daily limits.  Discussed cessation/primary prevention of drug use and availability of treatment for abuse.   Diet: Encouraged to adjust caloric intake to maintain  or achieve ideal body weight, to reduce intake of dietary saturated fat and total fat, to limit sodium intake by avoiding high sodium foods and not adding table salt, and to maintain adequate dietary potassium and calcium  preferably from fresh fruits, vegetables, and low-fat dairy products.    Stressed the importance of regular exercise  Injury prevention: Discussed safety belts, safety helmets, smoke detector, smoking near bedding or upholstery.   Dental health: Discussed importance of regular tooth brushing, flossing, and dental visits.    NEXT PREVENTATIVE PHYSICAL DUE IN 1 YEAR. Return in about 6 months (around 03/08/2025) for HTN/HLD, Breast CA.

## 2024-09-07 NOTE — Assessment & Plan Note (Signed)
 Diagnosed 03/28/24.  Continue collaboration with oncology, recent notes reviewed.

## 2024-09-07 NOTE — Assessment & Plan Note (Signed)
Chronic, ongoing.  She is tolerating Rosuvastatin.  Recommend she continue this and we will adjust as needed.  Lipid panel and CMP today.

## 2024-09-07 NOTE — Assessment & Plan Note (Signed)
 To be on for 5 years and is tolerating. Continue this treatment and collaboration with oncology.

## 2024-09-07 NOTE — Assessment & Plan Note (Signed)
 BMI 42.81.  Recommended eating smaller high protein, low fat meals more frequently and exercising 30 mins a day 5 times a week with a goal of 10-15lb weight loss in the next 3 months. Patient voiced their understanding and motivation to adhere to these recommendations.

## 2024-09-07 NOTE — Assessment & Plan Note (Signed)
 Chronic, stable.  BP well below goal in office and on home checks.  Recommend she monitor BP at least a few mornings a week at home and document.  DASH diet at home. Will discontinue Bystolic  for now since home BP very stable and HR in low 50's at home.  Continue remainder of current medication regimen and adjust as needed.  Refills sent in.  Labs: CBC, TSH, CMP.  Return in 6 months.

## 2024-09-08 ENCOUNTER — Ambulatory Visit: Payer: Self-pay | Admitting: Nurse Practitioner

## 2024-09-08 LAB — COMPREHENSIVE METABOLIC PANEL WITH GFR
ALT: 27 IU/L (ref 0–32)
AST: 20 IU/L (ref 0–40)
Albumin: 4.2 g/dL (ref 3.9–4.9)
Alkaline Phosphatase: 120 IU/L (ref 49–135)
BUN/Creatinine Ratio: 15 (ref 12–28)
BUN: 10 mg/dL (ref 8–27)
Bilirubin Total: 0.6 mg/dL (ref 0.0–1.2)
CO2: 25 mmol/L (ref 20–29)
Calcium: 10 mg/dL (ref 8.7–10.3)
Chloride: 97 mmol/L (ref 96–106)
Creatinine, Ser: 0.66 mg/dL (ref 0.57–1.00)
Globulin, Total: 2.6 g/dL (ref 1.5–4.5)
Glucose: 111 mg/dL — ABNORMAL HIGH (ref 70–99)
Potassium: 3.9 mmol/L (ref 3.5–5.2)
Sodium: 140 mmol/L (ref 134–144)
Total Protein: 6.8 g/dL (ref 6.0–8.5)
eGFR: 95 mL/min/1.73 (ref >59)

## 2024-09-08 LAB — CBC WITH DIFFERENTIAL/PLATELET
Basophils Absolute: 0 x10E3/uL (ref 0.0–0.2)
Basos: 1 %
EOS (ABSOLUTE): 0.1 x10E3/uL (ref 0.0–0.4)
Eos: 2 %
Hematocrit: 40.6 % (ref 34.0–46.6)
Hemoglobin: 13 g/dL (ref 11.1–15.9)
Immature Grans (Abs): 0 x10E3/uL (ref 0.0–0.1)
Immature Granulocytes: 0 %
Lymphocytes Absolute: 0.8 x10E3/uL (ref 0.7–3.1)
Lymphs: 15 %
MCH: 29.1 pg (ref 26.6–33.0)
MCHC: 32 g/dL (ref 31.5–35.7)
MCV: 91 fL (ref 79–97)
Monocytes Absolute: 0.5 x10E3/uL (ref 0.1–0.9)
Monocytes: 9 %
Neutrophils Absolute: 4.1 x10E3/uL (ref 1.4–7.0)
Neutrophils: 73 %
Platelets: 237 x10E3/uL (ref 150–450)
RBC: 4.47 x10E6/uL (ref 3.77–5.28)
RDW: 12.7 % (ref 11.7–15.4)
WBC: 5.6 x10E3/uL (ref 3.4–10.8)

## 2024-09-08 LAB — TSH: TSH: 2.7 u[IU]/mL (ref 0.450–4.500)

## 2024-09-08 LAB — LIPID PANEL W/O CHOL/HDL RATIO
Cholesterol, Total: 147 mg/dL (ref 100–199)
HDL: 64 mg/dL (ref >39)
LDL Chol Calc (NIH): 61 mg/dL (ref 0–99)
Triglycerides: 130 mg/dL (ref 0–149)
VLDL Cholesterol Cal: 22 mg/dL (ref 5–40)

## 2024-09-08 LAB — AMYLASE: Amylase: 59 U/L (ref 31–110)

## 2024-09-08 LAB — LIPASE: Lipase: 25 U/L (ref 14–72)

## 2024-09-08 LAB — MAGNESIUM: Magnesium: 2 mg/dL (ref 1.6–2.3)

## 2024-09-08 NOTE — Progress Notes (Signed)
 Contacted via MyChart  Good afternoon Pamela Reid, your labs have returned and overall are stable with exception of glucose mildly elevated which we will continue to monitor. No medication changes needed.  Any questions? Keep being amazing!!  Thank you for allowing me to participate in your care.  I appreciate you. Kindest regards, Cleaster Shiffer

## 2024-09-20 MED ORDER — ROSUVASTATIN CALCIUM 20 MG PO TABS: 20.0000 mg | ORAL_TABLET | Freq: Every day | ORAL | 4 refills | Status: AC

## 2024-09-29 ENCOUNTER — Other Ambulatory Visit: Payer: Self-pay | Admitting: Medical Genetics

## 2024-09-29 DIAGNOSIS — Z006 Encounter for examination for normal comparison and control in clinical research program: Secondary | ICD-10-CM

## 2024-10-04 ENCOUNTER — Other Ambulatory Visit: Payer: Self-pay | Admitting: Nurse Practitioner

## 2024-10-06 NOTE — Telephone Encounter (Signed)
 Requested medication (s) are due for refill today: routing for review  Requested medication (s) are on the active medication list: no  Last refill:  10/01/23  Future visit scheduled: {Yes  Notes to clinic:  routing for review, not on current list.     Requested Prescriptions  Pending Prescriptions Disp Refills   nebivolol  (BYSTOLIC ) 2.5 MG tablet [Pharmacy Med Name: Nebivolol  HCl 2.5 MG Oral Tablet] 100 tablet 2    Sig: TAKE 1 TABLET BY MOUTH DAILY FOR HYPERTENSION     Cardiovascular: Beta Blockers 3 Passed - 10/06/2024  1:54 PM      Passed - Cr in normal range and within 360 days    Creatinine, Ser  Date Value Ref Range Status  09/07/2024 0.66 0.57 - 1.00 mg/dL Final         Passed - AST in normal range and within 360 days    AST  Date Value Ref Range Status  09/07/2024 20 0 - 40 IU/L Final         Passed - ALT in normal range and within 360 days    ALT  Date Value Ref Range Status  09/07/2024 27 0 - 32 IU/L Final         Passed - Last BP in normal range    BP Readings from Last 1 Encounters:  09/07/24 131/72         Passed - Last Heart Rate in normal range    Pulse Readings from Last 1 Encounters:  09/07/24 65         Passed - Valid encounter within last 6 months    Recent Outpatient Visits           4 weeks ago Invasive carcinoma of breast (HCC)   [facility] [place], Jolene T, NP   7 months ago Class 3 severe obesity due to excess calories without serious comorbidity with body mass index (BMI) of 45.0 to 49.9 in adult [facility])   [place] [facility] Valerio Melanie DASEN, NP

## 2024-10-10 ENCOUNTER — Encounter: Payer: Self-pay | Admitting: Nurse Practitioner

## 2024-11-01 ENCOUNTER — Inpatient Hospital Stay: Admitting: Oncology

## 2024-11-01 ENCOUNTER — Inpatient Hospital Stay: Attending: Oncology

## 2024-11-01 ENCOUNTER — Encounter: Payer: Self-pay | Admitting: Oncology

## 2024-11-01 VITALS — BP 126/51 | HR 65 | Temp 98.3°F | Resp 20 | Wt 240.3 lb

## 2024-11-01 DIAGNOSIS — Z170 Estrogen receptor positive status [ER+]: Secondary | ICD-10-CM | POA: Diagnosis not present

## 2024-11-01 DIAGNOSIS — C50919 Malignant neoplasm of unspecified site of unspecified female breast: Secondary | ICD-10-CM | POA: Diagnosis not present

## 2024-11-01 DIAGNOSIS — Z803 Family history of malignant neoplasm of breast: Secondary | ICD-10-CM | POA: Insufficient documentation

## 2024-11-01 DIAGNOSIS — Z1721 Progesterone receptor positive status: Secondary | ICD-10-CM | POA: Insufficient documentation

## 2024-11-01 DIAGNOSIS — C50411 Malignant neoplasm of upper-outer quadrant of right female breast: Secondary | ICD-10-CM | POA: Diagnosis present

## 2024-11-01 DIAGNOSIS — Z923 Personal history of irradiation: Secondary | ICD-10-CM | POA: Insufficient documentation

## 2024-11-01 DIAGNOSIS — Z800 Family history of malignant neoplasm of digestive organs: Secondary | ICD-10-CM | POA: Diagnosis not present

## 2024-11-01 DIAGNOSIS — Z1732 Human epidermal growth factor receptor 2 negative status: Secondary | ICD-10-CM | POA: Insufficient documentation

## 2024-11-01 DIAGNOSIS — Z79811 Long term (current) use of aromatase inhibitors: Secondary | ICD-10-CM | POA: Diagnosis not present

## 2024-11-01 DIAGNOSIS — Z808 Family history of malignant neoplasm of other organs or systems: Secondary | ICD-10-CM | POA: Insufficient documentation

## 2024-11-01 DIAGNOSIS — Z90710 Acquired absence of both cervix and uterus: Secondary | ICD-10-CM | POA: Diagnosis not present

## 2024-11-01 LAB — CMP (CANCER CENTER ONLY)
ALT: 23 U/L (ref 0–44)
AST: 19 U/L (ref 15–41)
Albumin: 3.9 g/dL (ref 3.5–5.0)
Alkaline Phosphatase: 108 U/L (ref 38–126)
Anion gap: 11 (ref 5–15)
BUN: 10 mg/dL (ref 8–23)
CO2: 28 mmol/L (ref 22–32)
Calcium: 9.3 mg/dL (ref 8.9–10.3)
Chloride: 96 mmol/L — ABNORMAL LOW (ref 98–111)
Creatinine: 0.64 mg/dL (ref 0.44–1.00)
GFR, Estimated: >60 mL/min (ref >60)
Glucose, Bld: 108 mg/dL — ABNORMAL HIGH (ref 70–99)
Potassium: 4 mmol/L (ref 3.5–5.1)
Sodium: 135 mmol/L (ref 135–145)
Total Bilirubin: 0.7 mg/dL (ref 0.0–1.2)
Total Protein: 7.3 g/dL (ref 6.5–8.1)

## 2024-11-01 LAB — CBC WITH DIFFERENTIAL (CANCER CENTER ONLY)
Abs Immature Granulocytes: 0.02 K/uL (ref 0.00–0.07)
Basophils Absolute: 0 K/uL (ref 0.0–0.1)
Basophils Relative: 1 %
Eosinophils Absolute: 0.1 K/uL (ref 0.0–0.5)
Eosinophils Relative: 1 %
HCT: 38.4 % (ref 36.0–46.0)
Hemoglobin: 12.9 g/dL (ref 12.0–15.0)
Immature Granulocytes: 0 %
Lymphocytes Relative: 16 %
Lymphs Abs: 1 K/uL (ref 0.7–4.0)
MCH: 28.6 pg (ref 26.0–34.0)
MCHC: 33.6 g/dL (ref 30.0–36.0)
MCV: 85.1 fL (ref 80.0–100.0)
Monocytes Absolute: 0.6 K/uL (ref 0.1–1.0)
Monocytes Relative: 11 %
Neutro Abs: 4.4 K/uL (ref 1.7–7.7)
Neutrophils Relative %: 71 %
Platelet Count: 236 K/uL (ref 150–400)
RBC: 4.51 MIL/uL (ref 3.87–5.11)
RDW: 12.7 % (ref 11.5–15.5)
WBC Count: 6.1 K/uL (ref 4.0–10.5)
nRBC: 0 % (ref 0.0–0.2)

## 2024-11-01 MED ORDER — ANASTROZOLE 1 MG PO TABS: 1.0000 mg | ORAL_TABLET | Freq: Every day | ORAL | 1 refills | Status: DC

## 2024-11-01 NOTE — Assessment & Plan Note (Signed)
 Recommend calcium and vitamin D supplementation.

## 2024-11-01 NOTE — Progress Notes (Signed)
 Hematology/Oncology Progress note [phone] [phone]        REFERRING PROVIDER: Valerio Melanie DASEN, NP    CHIEF COMPLAINTS/PURPOSE OF CONSULTATION:  Stage I right breast invasive carcinoma  ASSESSMENT & PLAN:   Invasive carcinoma of breast (HCC) Stage I right breast invasive carcinoma. pT1b pNx, ER 100%, PR 40%, HER2 low (1+) Ki67 7% Pathology results were reviewed with patient.  Oncotype Dx 20, <1% chemotherapy benefit. No role for adjuvant chemotherapy.  S/p adjuvant radiation.  06/21/24 Normal DEXA Labs are reviewed and discussed with patient. Continue Arimidex  1mg  daily. She tolerates well.  March 2026 bilateral diagnostic mammogram      Aromatase inhibitor use Recommend calcium  and vitamin D supplementation.    No orders of the defined types were placed in this encounter.  Follow-up to be determined. All questions were answered. The patient knows to call the clinic with any problems, questions or concerns.  Zelphia Cap, MD, PhD [facility] Hematology Oncology 11/01/2024    HISTORY OF PRESENTING ILLNESS:  Pamela Reid 68 y.o. female presents to establish care for stage I right breast invasive ductal carcinoma. I have reviewed her chart and materials related to her cancer extensively and collaborated history with the patient. Summary of oncologic history is as follows: Oncology History  Invasive carcinoma of breast (HCC)  02/23/2024 Mammogram   Bilateral screening mammogram  In the right breast, possible distortion and separate calcifications warrant further evaluation. In the left breast, no findings suspicious for malignancy.   03/09/2024 Mammogram   Bilateral diagnostic mammogram w US  1. RIGHT breast 5 mm spiculated mass in the 11 o'clock position is intermediate suspicion for malignancy. Recommend further assessment with ultrasound-guided biopsy. 2. RIGHT breast 9 mm group of coarse heterogeneous calcification is low suspicion for  malignancy. Recommend further assessment with stereotactic guided biopsy.  Targeted right axillary ultrasound demonstrates multiple morphologically benign lymph nodes. No lymphadenopathy   03/28/2024 Initial Diagnosis   Invasive carcinoma of breast [facility])  Patient underwent right breast biopsy.  1. Breast, right, needle core biopsy, 11 o'clock (coil) :       INVASIVE DUCTAL CARCINOMA       TUBULE FORMATION: SCORE 2       NUCLEAR PLEOMORPHISM: SCORE 2       MITOTIC COUNT: SCORE 1       TOTAL SCORE: 5       OVERALL GRADE: 1       LYMPHOVASCULAR INVASION: NOT IDENTIFIED       CANCER LENGTH: 5 MM       CALCIFICATIONS: PRESENT       OTHER FINDINGS: NONE       ER 100%+, PR 40%+ HER2 low (1+)        2. Breast, right, needle core biopsy, upper outer quadrant (ribbon) :       BENIGN BREAST TISSUE WITH FIBROADENOMATOUS CHANGES AND CALCIFICATIONS.       NEGATIVE FOR ATYPIA OR MALIGNANCY.      03/28/2024 Cancer Staging   Staging form: Breast, AJCC 8th Edition - Clinical stage from 03/28/2024: Stage IA (cT1a, cN0, cM0, G1, ER+, PR+, HER2-) - Signed by Cap Zelphia, MD on 03/28/2024 Stage prefix: Initial diagnosis Histologic grading system: 3 grade system   04/07/2024 Surgery   Patient status post right breast lumpectomy Pathology showed  A. BREAST, RIGHT, LUMPECTOMY: - Invasive ductal carcinoma, 0.7 cm, grade 1 - Ductal carcinoma in situ: Not identified - Margins, invasive: All margins negative for invasive carcinoma  Closest, invasive: Posterior, 0.5 cm - Margins, DCIS: All margins negative for ductal carcinoma in situ     Closest, DCIS: Not applicable - Lymphovascular invasion: Not identified - Prognostic markers:  ER 100%, positive, strong staining intensity, PR 40%, positive, moderate to strong staining intensity, Her2 negative, Ki-67 7% - Other: Biopsy site changes present - See oncology table  ONCOLOGY TABLE:  INVASIVE CARCINOMA OF THE BREAST:  Resection  Procedure:  Excision Specimen Laterality: Right Histologic Type: Invasive ductal carcinoma Histologic Grade:      Glandular (Acinar)/Tubular Differentiation: 2      Nuclear Pleomorphism: 2      Mitotic Rate: 1      Overall Grade: 1 Tumor Size: 7 mm Ductal Carcinoma In Situ: Not identified Lymphatic and/or Vascular Invasion: Not identified Treatment Effect in the Breast: No known presurgical therapy Margins: All margins negative for invasive carcinoma      Distance from Closest Margin (mm): 5 mm      Specify Closest Margin (required only if <45mm): Posterior DCIS Margins: Uninvolved by DCIS      Distance from Closest Margin (mm): Not applicable      Specify Closest Margin (required only if <29mm): Not applicable Regional Lymph Nodes: Not applicable      Distant Metastasis:      Distant Site(s) Involved: Not applicable Breast Biomarker Testing Performed on Previous Biopsy:      Testing Performed on Case Number: [id number] ER 100%, positive, strong staining intensity, PR 40% positive, moderate to strong staining intensity, Her2 negative, Ki-67 7% Pathologic Stage Classification (pTNM, AJCC 8th Edition):  pT1b, pNx Representative Tumor Block: A2     05/11/2024 - 06/21/2024 Radiation Therapy   S/p adjvuant breast radiation.    08/02/2024 -  Anti-estrogen oral therapy   Arimidex  1mg  daily   Patient has a history of early-stage uterine cancer status post total hysterectomy.  Strong family history of GI cancer.  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 Positive for family history of breast cancer.  Patient presents for follow up   Started on Arimidex  1mg  daily. She tolerates well. Manageable hot flash.   MEDICAL HISTORY:  Past Medical History:  Diagnosis Date   Allergy    Breast cancer (HCC) 03-23-24   Cancer [facility])    Family history of breast cancer    Family history of melanoma    GERD (gastroesophageal reflux disease)    Heart murmur as  a child - mitral valve   Hyperlipidemia    Hypertension    Lynch syndrome    Thyroid  disease Thyroid  biopsy for tumor 2013   Uterine cancer (HCC) 2015    SURGICAL HISTORY: Past Surgical History:  Procedure Laterality Date   ABDOMINAL HYSTERECTOMY     BREAST BIOPSY Left 2019   stereotatic bx neg   BREAST BIOPSY Right 03/22/2024   Stereo bx, ribbon clip, path pending   BREAST BIOPSY Right 03/22/2024   MM RT BREAST BX W LOC DEV 1ST LESION IMAGE BX SPEC STEREO GUIDE 03/22/2024 [facility]   BREAST BIOPSY Right 03/22/2024   US  RT BREAST BX W LOC DEV 1ST LESION IMG BX SPEC US  GUIDE 03/22/2024 [facility]   BREAST BIOPSY  04/05/2024   US  RT RADIOACTIVE SEED LOC 04/05/2024 [facility]   BREAST BIOPSY Right 04/05/2024   US  RT RADIOACTIVE SEED EA ADD LESION 04/05/2024 [facility]   BREAST LUMPECTOMY WITH RADIOACTIVE SEED LOCALIZATION Right 04/07/2024   Procedure: BREAST LUMPECTOMY WITH RADIOACTIVE  SEED LOCALIZATION;  Surgeon: Ebbie Cough, MD;  Location: [facility];  Service: General;  Laterality: Right;   CHOLECYSTECTOMY     COLON SURGERY  2017   COLONOSCOPY WITH PROPOFOL  N/A 09/11/2021   Procedure: COLONOSCOPY WITH PROPOFOL ;  Surgeon: Unk Corinn Skiff, MD;  Location: [facility];  Service: Gastroenterology;  Laterality: N/A;   TUBAL LIGATION  1986    SOCIAL HISTORY: Social History   Socioeconomic History   Marital status: Married    Spouse name: Oneil   Number of children: 2   Years of education: Not on file   Highest education level: Professional school degree (e.g., MD, DDS, DVM, JD)  Occupational History   Occupation: part time consulting & teaching  Tobacco Use   Smoking status: Never   Smokeless tobacco: Never  Vaping Use   Vaping status: Never Used  Substance and Sexual Activity   Alcohol use: Yes    Alcohol/week: 1.0 standard drink of alcohol    Types: 1 Glasses of wine per week    Comment: 1 glass of wine monthly or less    Drug use: Never   Sexual activity: Yes    Birth control/protection: Post-menopausal  Other Topics Concern   Not on file  Social History Narrative   Married, 2 children and 7 grandchildren   Social Drivers of Corporate Investment Banker Strain: Low Risk  (07/19/2024)   Overall Financial Resource Strain (CARDIA)    Difficulty of Paying Living Expenses: Not hard at all  Food Insecurity: No Food Insecurity (07/19/2024)   Hunger Vital Sign    Worried About Running Out of Food in the Last Year: Never true    Ran Out of Food in the Last Year: Never true  Transportation Needs: No Transportation Needs (07/19/2024)   PRAPARE - Administrator, Civil Service (Medical): No    Lack of Transportation (Non-Medical): No  Physical Activity: Sufficiently Active (07/19/2024)   Exercise Vital Sign    Days of Exercise per Week: 3 days    Minutes of Exercise per Session: 60 min  Stress: No Stress Concern Present (07/19/2024)   [employer] of Occupational Health - Occupational Stress Questionnaire    Feeling of Stress: Not at all  Social Connections: Moderately Isolated (07/19/2024)   Social Connection and Isolation Panel    Frequency of Communication with Friends and Family: More than three times a week    Frequency of Social Gatherings with Friends and Family: Twice a week    Attends Religious Services: Never    Database Administrator or Organizations: No    Attends Banker Meetings: Never    Marital Status: Married  Catering Manager Violence: Not At Risk (07/19/2024)   Humiliation, Afraid, Rape, and Kick questionnaire    Fear of Current or Ex-Partner: No    Emotionally Abused: No    Physically Abused: No    Sexually Abused: No    FAMILY HISTORY: Family History  Problem Relation Age of Onset   Hypertension Mother    Melanoma Mother    Asthma Mother    Hyperlipidemia Mother    Parkinson's disease Father    Dementia Father    Hyperlipidemia Father    Hypertension  Father    Healthy Sister    Healthy Brother    Breast cancer Maternal Aunt        dx 29s   Melanoma Paternal Aunt    Cancer Maternal Grandmother  GI cancer dx 60s   Cancer Maternal Grandfather        GI cancer dx 39s   Breast cancer Paternal Grandmother        dx 40s   Cancer Paternal Grandmother    Parkinson's disease Paternal Grandfather    Healthy Daughter    Healthy Son    Cancer Maternal Aunt     ALLERGIES:  is allergic to cephalexin and penicillins.  MEDICATIONS:  Current Outpatient Medications  Medication Sig Dispense Refill   Aspirin 81 MG CAPS Take by mouth.     Calcium  Polycarbophil (FIBER) 625 MG TABS Take by mouth.     Cholecalciferol 75 MCG (3000 UT) TABS Take by mouth.     co-enzyme Q-10 30 MG capsule Take by mouth.     esomeprazole (NEXIUM) 20 MG capsule Take by mouth.     ibuprofen (ADVIL) 200 MG tablet Take by mouth.     losartan -hydrochlorothiazide (HYZAAR) 100-25 MG tablet Take 1 tablet by mouth daily. 100 tablet 4   mometasone  (ELOCON ) 0.1 % lotion APPLY TOPICALLY DAILY AS NEEDED UP TO 5 DAYS PER WEEK 120 mL 2   Multiple Vitamin (MULTI-VITAMINS) TABS Take 1 tablet by mouth daily.     nebivolol  (BYSTOLIC ) 2.5 MG tablet TAKE 1 TABLET BY MOUTH DAILY FOR HYPERTENSION 100 tablet 2   rosuvastatin  (CRESTOR ) 20 MG tablet Take 1 tablet (20 mg total) by mouth daily. 90 tablet 4   anastrozole  (ARIMIDEX ) 1 MG tablet Take 1 tablet (1 mg total) by mouth daily. 90 tablet 1   ketoconazole  (NIZORAL ) 2 % shampoo SHAMPOO INTO THE SCALP LET SIT 5 TO 10 MINUTES THEN WASH OFF USE  3 DAYS PER WEEK (Patient taking differently: SHAMPOO INTO THE SCALP LET SIT 5 TO 10 MINUTES THEN WASH OFF USE  3 DAYS PER WEEK or PRN) 120 mL 2   No current facility-administered medications for this visit.    Review of Systems  Constitutional:  Negative for appetite change, chills, fatigue and fever.  HENT:   Negative for hearing loss and voice change.   Eyes:  Negative for eye problems.   Respiratory:  Negative for chest tightness and cough.   Cardiovascular:  Negative for chest pain.  Gastrointestinal:  Negative for abdominal distention, abdominal pain and blood in stool.  Endocrine: Positive for hot flashes.  Genitourinary:  Negative for difficulty urinating and frequency.   Musculoskeletal:  Negative for arthralgias.  Skin:  Negative for itching and rash.  Neurological:  Negative for extremity weakness.  Hematological:  Negative for adenopathy.  Psychiatric/Behavioral:  Negative for confusion.      PHYSICAL EXAMINATION: ECOG PERFORMANCE STATUS: 0 - Asymptomatic  Vitals:   11/01/24 1030  BP: (!) 126/51  Pulse: 65  Resp: 20  Temp: 98.3 F (36.8 C)  SpO2: 97%   Filed Weights   11/01/24 1030  Weight: 240 lb 4.8 oz (109 kg)    Physical Exam Constitutional:      General: She is not in acute distress.    Appearance: She is obese. She is not diaphoretic.  HENT:     Head: Normocephalic.  Eyes:     General: No scleral icterus. Cardiovascular:     Rate and Rhythm: Normal rate and regular rhythm.     Heart sounds: No murmur heard. Pulmonary:     Effort: Pulmonary effort is normal. No respiratory distress.     Breath sounds: No wheezing.  Abdominal:     General: There is no distension.  Palpations: Abdomen is soft.     Tenderness: There is no abdominal tenderness.  Musculoskeletal:        General: Normal range of motion.     Cervical back: Normal range of motion and neck supple.  Skin:    General: Skin is warm and dry.     Findings: No erythema.  Neurological:     Mental Status: She is alert and oriented to person, place, and time. Mental status is at baseline.     Motor: No abnormal muscle tone.  Psychiatric:        Mood and Affect: Mood and affect normal.      LABORATORY DATA:  I have reviewed the data as listed    Latest Ref Rng & Units 11/01/2024   10:11 AM 09/07/2024    8:28 AM 05/16/2024    2:25 PM  CBC  WBC 4.0 - 10.5 K/uL 6.1  5.6   9.6   Hemoglobin 12.0 - 15.0 g/dL 87.0  86.9  86.5   Hematocrit 36.0 - 46.0 % 38.4  40.6  39.8   Platelets 150 - 400 K/uL 236  237  286       Latest Ref Rng & Units 11/01/2024   10:11 AM 09/07/2024    8:28 AM 03/28/2024   10:21 AM  CMP  Glucose 70 - 99 mg/dL 891  888  878   BUN 8 - 23 mg/dL 10  10  8    Creatinine 0.44 - 1.00 mg/dL 9.35  9.33  9.37   Sodium 135 - 145 mmol/L 135  140  135   Potassium 3.5 - 5.1 mmol/L 4.0  3.9  3.6   Chloride 98 - 111 mmol/L 96  97  99   CO2 22 - 32 mmol/L 28  25  24    Calcium  8.9 - 10.3 mg/dL 9.3  89.9  9.3   Total Protein 6.5 - 8.1 g/dL 7.3  6.8  7.3   Total Bilirubin 0.0 - 1.2 mg/dL 0.7  0.6  0.7   Alkaline Phos 38 - 126 U/L 108  120  122   AST 15 - 41 U/L 19  20  21    ALT 0 - 44 U/L 23  27  33      RADIOGRAPHIC STUDIES: I have personally reviewed the radiological images as listed and agreed with the findings in the report. No results found.

## 2024-11-01 NOTE — Assessment & Plan Note (Addendum)
 Stage I right breast invasive carcinoma. pT1b pNx, ER 100%, PR 40%, HER2 low (1+) Ki67 7% Pathology results were reviewed with patient.  Oncotype Dx 20, <1% chemotherapy benefit. No role for adjuvant chemotherapy.  S/p adjuvant radiation.  06/21/24 Normal DEXA Labs are reviewed and discussed with patient. Continue Arimidex  1mg  daily. She tolerates well.  March 2026 bilateral diagnostic mammogram

## 2024-11-23 ENCOUNTER — Other Ambulatory Visit: Payer: Self-pay | Admitting: Oncology

## 2025-01-05 ENCOUNTER — Other Ambulatory Visit: Payer: Self-pay | Admitting: Oncology

## 2025-01-05 ENCOUNTER — Other Ambulatory Visit: Payer: Self-pay

## 2025-01-05 DIAGNOSIS — R928 Other abnormal and inconclusive findings on diagnostic imaging of breast: Secondary | ICD-10-CM

## 2025-01-05 DIAGNOSIS — C50919 Malignant neoplasm of unspecified site of unspecified female breast: Secondary | ICD-10-CM

## 2025-01-05 DIAGNOSIS — Z853 Personal history of malignant neoplasm of breast: Secondary | ICD-10-CM

## 2025-01-26 ENCOUNTER — Ambulatory Visit: Admitting: Radiation Oncology

## 2025-03-08 ENCOUNTER — Ambulatory Visit: Admitting: Nurse Practitioner

## 2025-05-02 ENCOUNTER — Inpatient Hospital Stay

## 2025-05-02 ENCOUNTER — Inpatient Hospital Stay: Admitting: Oncology

## 2025-07-25 ENCOUNTER — Ambulatory Visit
